# Patient Record
Sex: Female | Born: 1970 | Race: White | Hispanic: No | Marital: Married | Smoking: Never smoker
Health system: Southern US, Community
[De-identification: ages and names within clinical notes are randomized; demographics above are authoritative.]

## PROBLEM LIST (undated history)

## (undated) DIAGNOSIS — M1990 Unspecified osteoarthritis, unspecified site: Secondary | ICD-10-CM

## (undated) DIAGNOSIS — K219 Gastro-esophageal reflux disease without esophagitis: Secondary | ICD-10-CM

## (undated) DIAGNOSIS — I10 Essential (primary) hypertension: Secondary | ICD-10-CM

## (undated) HISTORY — PX: FOOT SURGERY: SHX648

## (undated) HISTORY — DX: Unspecified osteoarthritis, unspecified site: M19.90

## (undated) HISTORY — PX: JOINT REPLACEMENT: SHX530

## (undated) HISTORY — DX: Essential (primary) hypertension: I10

## (undated) HISTORY — DX: Gastro-esophageal reflux disease without esophagitis: K21.9

---

## 2003-01-03 HISTORY — PX: TUBAL LIGATION: SHX77

## 2006-01-11 ENCOUNTER — Ambulatory Visit: Payer: Self-pay

## 2010-04-27 ENCOUNTER — Ambulatory Visit: Payer: Self-pay

## 2011-06-16 LAB — HM PAP SMEAR: HM PAP: NEGATIVE

## 2011-06-20 ENCOUNTER — Ambulatory Visit: Payer: Self-pay

## 2012-06-25 ENCOUNTER — Ambulatory Visit: Payer: Self-pay | Admitting: Obstetrics and Gynecology

## 2012-07-15 ENCOUNTER — Ambulatory Visit: Payer: Self-pay | Admitting: Obstetrics and Gynecology

## 2013-02-07 ENCOUNTER — Ambulatory Visit: Payer: Self-pay | Admitting: Obstetrics and Gynecology

## 2016-02-08 DIAGNOSIS — M1711 Unilateral primary osteoarthritis, right knee: Secondary | ICD-10-CM | POA: Diagnosis not present

## 2016-05-18 DIAGNOSIS — S46011A Strain of muscle(s) and tendon(s) of the rotator cuff of right shoulder, initial encounter: Secondary | ICD-10-CM | POA: Diagnosis not present

## 2016-07-10 DIAGNOSIS — M1711 Unilateral primary osteoarthritis, right knee: Secondary | ICD-10-CM | POA: Diagnosis not present

## 2016-07-10 DIAGNOSIS — S83281A Other tear of lateral meniscus, current injury, right knee, initial encounter: Secondary | ICD-10-CM | POA: Diagnosis not present

## 2016-07-17 DIAGNOSIS — M25561 Pain in right knee: Secondary | ICD-10-CM | POA: Diagnosis not present

## 2016-11-08 ENCOUNTER — Encounter: Payer: Self-pay | Admitting: Family Medicine

## 2016-11-08 ENCOUNTER — Other Ambulatory Visit: Payer: Self-pay

## 2016-11-08 ENCOUNTER — Ambulatory Visit: Payer: 59 | Admitting: Family Medicine

## 2016-11-08 VITALS — BP 126/84 | HR 69 | Temp 98.5°F | Resp 16 | Ht 61.0 in | Wt 194.0 lb

## 2016-11-08 DIAGNOSIS — Z7689 Persons encountering health services in other specified circumstances: Secondary | ICD-10-CM | POA: Diagnosis not present

## 2016-11-08 DIAGNOSIS — L659 Nonscarring hair loss, unspecified: Secondary | ICD-10-CM

## 2016-11-08 DIAGNOSIS — Z6836 Body mass index (BMI) 36.0-36.9, adult: Secondary | ICD-10-CM

## 2016-11-08 DIAGNOSIS — M179 Osteoarthritis of knee, unspecified: Secondary | ICD-10-CM | POA: Insufficient documentation

## 2016-11-08 DIAGNOSIS — E669 Obesity, unspecified: Secondary | ICD-10-CM | POA: Diagnosis not present

## 2016-11-08 DIAGNOSIS — I872 Venous insufficiency (chronic) (peripheral): Secondary | ICD-10-CM | POA: Diagnosis not present

## 2016-11-08 DIAGNOSIS — Z23 Encounter for immunization: Secondary | ICD-10-CM | POA: Diagnosis not present

## 2016-11-08 DIAGNOSIS — M1710 Unilateral primary osteoarthritis, unspecified knee: Secondary | ICD-10-CM | POA: Insufficient documentation

## 2016-11-08 DIAGNOSIS — M170 Bilateral primary osteoarthritis of knee: Secondary | ICD-10-CM | POA: Diagnosis not present

## 2016-11-08 DIAGNOSIS — Z114 Encounter for screening for human immunodeficiency virus [HIV]: Secondary | ICD-10-CM

## 2016-11-08 NOTE — Assessment & Plan Note (Signed)
Encouraged patient on weight loss Continue weight watchers

## 2016-11-08 NOTE — Addendum Note (Signed)
Addended by: Brennan Bailey on: 11/08/2016 02:19 PM   Modules accepted: Orders

## 2016-11-08 NOTE — Progress Notes (Signed)
Patient: Kendra Kim Female    DOB: Jul 14, 1970   46 y.o.   MRN: [id number] Visit Date: 11/08/2016  Today's Provider: Lavon Paganini, MD   Chief Complaint  Patient presents with  . Establish Care   Subjective:    HPI   Kendra Kim presents to establish care. She is c/o hair thinning, which is improved with Biotin, MVI, and coconut oil treatments once weekly. She is also c/o ankle/feet swelling. Swelling is associated with traveling, and is improved with OTC diuretics (diurex) - thinks she is taking this at least 3-4 times weekly. She agrees to update her tetanus shot, but declines flu.  R knee pain: Diagnosed with OA, seeing orthopedics, s/p cortisone injection in R knee in 01/2016, 07/2016.  Also doing PT which helps.  Notices it hurts worse when raining or weather changes.  Using ice and heat.  Doing weight watchers and down 28lbs since starting.  She is motivated to continue to lose weight.  No Known Allergies   Current Outpatient Medications:  .  BIOTIN PO, Take by mouth., Disp: , Rfl:  .  Multiple Vitamin (MULTIVITAMIN) capsule, Take 1 capsule daily by mouth., Disp: , Rfl:   Review of Systems  Constitutional: Negative.   HENT: Negative.   Eyes: Negative.   Respiratory: Negative.   Cardiovascular: Positive for leg swelling. Negative for chest pain and palpitations.  Gastrointestinal: Negative.   Endocrine: Negative.   Genitourinary: Negative.   Musculoskeletal: Negative.   Skin: Negative.   Allergic/Immunologic: Negative.   Neurological: Negative.   Hematological: Negative.   Psychiatric/Behavioral: Negative.    History reviewed. No pertinent past medical history.  Past Surgical History:  Procedure Laterality Date  . FOOT SURGERY Right    x 2, realigned 2nd toe and then fused, by podiatrist (Dr Paulla Dolly)  . TUBAL LIGATION  2005   Family History  Problem Relation Age of Onset  . Epilepsy Mother   . Osteoporosis Mother   . Colon cancer Mother 34  .  Macular degeneration Mother   . Deep vein thrombosis Mother        after ortho injury  . Hypertension Father   . Glaucoma Father   . Bipolar disorder Sister   . Lymphoma Sister   . Breast cancer Maternal Grandmother 80  . Diabetes Paternal Grandmother   . Diabetes Paternal Grandfather     Social History   Tobacco Use  . Smoking status: Not on file  Substance Use Topics  . Alcohol use: Not on file   Objective:   BP 126/84 (BP Location: Left Arm, Patient Position: Sitting, Cuff Size: Large)   Pulse 69   Temp 98.5 F (36.9 C) (Oral)   Resp 16   Ht 5\' 1"  (1.549 m)   Wt 194 lb (88 kg)   SpO2 98%   BMI 36.66 kg/m  Vitals:   11/08/16 1009  BP: 126/84  Pulse: 69  Resp: 16  Temp: 98.5 F (36.9 C)  TempSrc: Oral  SpO2: 98%  Weight: 194 lb (88 kg)  Height: 5\' 1"  (1.549 m)     Physical Exam  Constitutional: She is oriented to person, place, and time. She appears well-developed and well-nourished. No distress.  Cardiovascular: Normal rate, regular rhythm, normal heart sounds and intact distal pulses.  No murmur heard. Pulmonary/Chest: Effort normal and breath sounds normal. No respiratory distress. She has no wheezes. She has no rales.  Abdominal: Soft. Bowel sounds are normal. She exhibits no distension.  There is no tenderness. There is no rebound and no guarding.  Musculoskeletal: She exhibits no edema or deformity.  Neurological: She is alert and oriented to person, place, and time. No cranial nerve deficit.  Skin: Skin is warm and dry. No rash noted.  Psychiatric: She has a normal mood and affect. Her behavior is normal.  Vitals reviewed.       Assessment & Plan:      Problem List Items Addressed This Visit      Cardiovascular and Mediastinum   Venous insufficiency of both lower extremities    Reassured patient that history, exam c/w venous insufficiency Check CBC, CMP, TSH to evaluate for systemic causes Advised compression stockings No diuretic indicated  at this time      Relevant Medications   Caffeine-Magnesium Salicylate (DIUREX PO)   Other Relevant Orders   CBC   Comprehensive metabolic panel   TSH     Musculoskeletal and Integument   Knee osteoarthritis    Discussed goal of QoL and ability to perform daily activities in managing knee OA No pain currently, but has trouble with R knee Can continue prn knee injections Will send ROI to Ortho        Other   Hair loss    No baldness or scalp rash noted on exam Suspect normal hair loss Check labs Ok to continue vitamins      Relevant Orders   CBC   TSH   Obesity    Encouraged patient on weight loss Continue weight watchers      Relevant Orders   CBC   Comprehensive metabolic panel   Lipid panel   TSH   Hemoglobin A1c    Other Visit Diagnoses    Encounter to establish care    -  Primary   Encounter for screening for HIV       Relevant Orders   HIV antibody (with reflex)      Return in about 2 months (around 01/08/2017) for CPE.      The entirety of the information documented in the History of Present Illness, Review of Systems and Physical Exam were personally obtained by me. Portions of this information were initially documented by Raquel Sarna Ratchford, CMA and reviewed by me for thoroughness and accuracy.     Kendra Paganini, MD  [facility]

## 2016-11-08 NOTE — Assessment & Plan Note (Signed)
Discussed goal of QoL and ability to perform daily activities in managing knee OA No pain currently, but has trouble with R knee Can continue prn knee injections Will send ROI to Ortho

## 2016-11-08 NOTE — Assessment & Plan Note (Signed)
Reassured patient that history, exam c/w venous insufficiency Check CBC, CMP, TSH to evaluate for systemic causes Advised compression stockings No diuretic indicated at this time

## 2016-11-08 NOTE — Assessment & Plan Note (Signed)
No baldness or scalp rash noted on exam Suspect normal hair loss Check labs Ok to continue vitamins

## 2016-11-08 NOTE — Patient Instructions (Signed)

## 2016-11-10 DIAGNOSIS — L659 Nonscarring hair loss, unspecified: Secondary | ICD-10-CM | POA: Diagnosis not present

## 2016-11-10 DIAGNOSIS — I872 Venous insufficiency (chronic) (peripheral): Secondary | ICD-10-CM | POA: Diagnosis not present

## 2016-11-11 LAB — COMPREHENSIVE METABOLIC PANEL
A/G RATIO: 1.8 (ref 1.2–2.2)
ALBUMIN: 4.4 g/dL (ref 3.5–5.5)
ALT: 22 IU/L (ref 0–32)
AST: 16 IU/L (ref 0–40)
Alkaline Phosphatase: 144 IU/L — ABNORMAL HIGH (ref 39–117)
BILIRUBIN TOTAL: 0.5 mg/dL (ref 0.0–1.2)
BUN / CREAT RATIO: 18 (ref 9–23)
BUN: 14 mg/dL (ref 6–24)
CHLORIDE: 104 mmol/L (ref 96–106)
CO2: 22 mmol/L (ref 20–29)
Calcium: 9.6 mg/dL (ref 8.7–10.2)
Creatinine, Ser: 0.78 mg/dL (ref 0.57–1.00)
GFR calc non Af Amer: 91 mL/min/{1.73_m2} (ref >59)
GFR, EST AFRICAN AMERICAN: 105 mL/min/{1.73_m2} (ref >59)
GLOBULIN, TOTAL: 2.5 g/dL (ref 1.5–4.5)
Glucose: 86 mg/dL (ref 65–99)
POTASSIUM: 4.7 mmol/L (ref 3.5–5.2)
SODIUM: 141 mmol/L (ref 134–144)
TOTAL PROTEIN: 6.9 g/dL (ref 6.0–8.5)

## 2016-11-11 LAB — LIPID PANEL
Chol/HDL Ratio: 2.9 ratio (ref 0.0–4.4)
Cholesterol, Total: 162 mg/dL (ref 100–199)
HDL: 56 mg/dL (ref >39)
LDL Calculated: 96 mg/dL (ref 0–99)
Triglycerides: 52 mg/dL (ref 0–149)
VLDL CHOLESTEROL CAL: 10 mg/dL (ref 5–40)

## 2016-11-11 LAB — CBC
HEMATOCRIT: 39.3 % (ref 34.0–46.6)
Hemoglobin: 13.3 g/dL (ref 11.1–15.9)
MCH: 30.8 pg (ref 26.6–33.0)
MCHC: 33.8 g/dL (ref 31.5–35.7)
MCV: 91 fL (ref 79–97)
Platelets: 303 10*3/uL (ref 150–379)
RBC: 4.32 x10E6/uL (ref 3.77–5.28)
RDW: 13 % (ref 12.3–15.4)
WBC: 4 10*3/uL (ref 3.4–10.8)

## 2016-11-11 LAB — HEMOGLOBIN A1C
ESTIMATED AVERAGE GLUCOSE: 103 mg/dL
Hgb A1c MFr Bld: 5.2 % (ref 4.8–5.6)

## 2016-11-11 LAB — HIV ANTIBODY (ROUTINE TESTING W REFLEX): HIV SCREEN 4TH GENERATION: NONREACTIVE

## 2016-11-11 LAB — TSH: TSH: 1.45 u[IU]/mL (ref 0.450–4.500)

## 2016-11-13 ENCOUNTER — Telehealth: Payer: Self-pay

## 2016-11-13 NOTE — Telephone Encounter (Signed)
-----   Message from Virginia Crews, MD sent at 11/13/2016  8:30 AM EST ----- Normal Blood counts, kidney function, liver function, electrolytes, cholesterol, Thyroid function, A1c (no diabetes).  Negative HIV screening  Bacigalupo, Dionne Bucy, MD, MPH [facility] 11/13/2016 8:29 AM

## 2016-11-13 NOTE — Telephone Encounter (Signed)
lmtcb

## 2016-11-14 NOTE — Telephone Encounter (Signed)
lmtcb

## 2016-11-16 NOTE — Telephone Encounter (Signed)
lmtcb but also mailed letter.

## 2016-11-16 NOTE — Telephone Encounter (Signed)
Pt called back. Advised of results.

## 2016-11-17 ENCOUNTER — Encounter: Payer: Self-pay | Admitting: Family Medicine

## 2017-01-08 ENCOUNTER — Encounter: Payer: Self-pay | Admitting: Family Medicine

## 2017-02-20 ENCOUNTER — Encounter: Payer: Self-pay | Admitting: Family Medicine

## 2017-03-22 ENCOUNTER — Encounter: Payer: Self-pay | Admitting: Family Medicine

## 2017-03-22 ENCOUNTER — Ambulatory Visit (INDEPENDENT_AMBULATORY_CARE_PROVIDER_SITE_OTHER): Payer: 59 | Admitting: Family Medicine

## 2017-03-22 VITALS — BP 122/84 | HR 74 | Temp 98.7°F | Resp 16 | Ht 61.0 in | Wt 200.0 lb

## 2017-03-22 DIAGNOSIS — Z1231 Encounter for screening mammogram for malignant neoplasm of breast: Secondary | ICD-10-CM | POA: Diagnosis not present

## 2017-03-22 DIAGNOSIS — E669 Obesity, unspecified: Secondary | ICD-10-CM | POA: Diagnosis not present

## 2017-03-22 DIAGNOSIS — E66812 Obesity, class 2: Secondary | ICD-10-CM

## 2017-03-22 DIAGNOSIS — Z124 Encounter for screening for malignant neoplasm of cervix: Secondary | ICD-10-CM | POA: Diagnosis not present

## 2017-03-22 DIAGNOSIS — Z0000 Encounter for general adult medical examination without abnormal findings: Secondary | ICD-10-CM | POA: Diagnosis not present

## 2017-03-22 DIAGNOSIS — Z6837 Body mass index (BMI) 37.0-37.9, adult: Secondary | ICD-10-CM

## 2017-03-22 DIAGNOSIS — Z1239 Encounter for other screening for malignant neoplasm of breast: Secondary | ICD-10-CM

## 2017-03-22 NOTE — Progress Notes (Signed)
Patient: Kendra Kim, Female    DOB: 1970-04-04, 47 y.o.   MRN: [id number] Visit Date: 03/22/2017  Today's Provider: Lavon Paganini, MD   I, Martha Clan, CMA, am acting as scribe for Lavon Paganini, MD.  Chief Complaint  Patient presents with  . Annual Exam   Subjective:    Annual physical exam Kendra Kim is a 47 y.o. female who presents today for health maintenance and complete physical. She feels fairly well. She is c/o right knee pain, which was diagnosed as osteoarthritis. Was advised at [facility] that PCP will do injections. She states her last injection was July, 2018.   She reports exercising twice a week for 45 minutes with a trainer; also does cardio when she is able. She reports she is sleeping well.  Has been doing weight watchers, but not doing well the last several weeks.  Last mammogram- 02/07/2013- UL Right- BI-RADS 1 Last pap- 06/16/2011- NIL; HPV negative. No h/o abnormal pap smears. -----------------------------------------------------------------   Review of Systems  Constitutional: Negative.   HENT: Positive for postnasal drip and sinus pressure. Negative for congestion, dental problem, drooling, ear discharge, ear pain, facial swelling, hearing loss, mouth sores, nosebleeds, rhinorrhea, sinus pain, sneezing, sore throat, tinnitus, trouble swallowing and voice change.   Eyes: Negative.   Respiratory: Positive for cough. Negative for apnea, choking, chest tightness, shortness of breath, wheezing and stridor.   Cardiovascular: Negative.   Gastrointestinal: Negative.   Endocrine: Negative.   Genitourinary: Negative.   Musculoskeletal: Positive for arthralgias. Negative for back pain, gait problem, joint swelling, myalgias, neck pain and neck stiffness.  Skin: Negative.   Allergic/Immunologic: Negative.   Neurological: Negative.   Hematological: Negative.   Psychiatric/Behavioral: Negative.     Social History      She  reports that she  has never smoked. She has never used smokeless tobacco. She reports that she drinks alcohol. She reports that she does not use drugs.       Social History   Socioeconomic History  . Marital status: Married    Spouse name: Gerald Stabs  . Number of children: 3  . Years of education: 56  . Highest education level: Associate degree: academic program  Occupational History    Employer: [employer]  Social Needs  . Financial resource strain: Not hard at all  . Food insecurity:    Worry: Never true    Inability: Never true  . Transportation needs:    Medical: No    Non-medical: No  Tobacco Use  . Smoking status: Never Smoker  . Smokeless tobacco: Never Used  Substance and Sexual Activity  . Alcohol use: Yes    Comment: social  . Drug use: Never  . Sexual activity: Yes    Partners: Male    Birth control/protection: Surgical  Lifestyle  . Physical activity:    Days per week: 2 days    Minutes per session: 40 min  . Stress: Not on file  Relationships  . Social connections:    Talks on phone: Not on file    Gets together: Not on file    Attends religious service: Not on file    Active member of club or organization: Not on file    Attends meetings of clubs or organizations: Not on file    Relationship status: Not on file  Other Topics Concern  . Not on file  Social History Narrative  . Not on file    History reviewed. No pertinent past  medical history.   Patient Active Problem List   Diagnosis Date Noted  . Venous insufficiency of both lower extremities 11/08/2016  . Hair loss 11/08/2016  . Obesity 11/08/2016  . Knee osteoarthritis 11/08/2016    Past Surgical History:  Procedure Laterality Date  . FOOT SURGERY Right    x 2, realigned 2nd toe and then fused, by podiatrist (Dr Paulla Dolly)  . TUBAL LIGATION  2005    Family History        Family Status  Relation Name Status  . Mother  Alive  . Father  Alive  . Sister  Alive  . MGM  (Not Specified)  . PGM  (Not Specified)    . PGF  (Not Specified)        Her family history includes Bipolar disorder in her sister; Breast cancer (age of onset: 75) in her maternal grandmother; Colon cancer (age of onset: 56) in her mother; Deep vein thrombosis in her mother; Diabetes in her paternal grandfather and paternal grandmother; Epilepsy in her mother; Glaucoma in her father; Hypertension in her father; Lymphoma in her sister; Macular degeneration in her mother; Osteoporosis in her mother.      No Known Allergies   Current Outpatient Medications:  .  BIOTIN PO, Take by mouth., Disp: , Rfl:  .  Caffeine-Magnesium Salicylate (DIUREX PO), Take 1 tablet 2 (two) times daily by mouth., Disp: , Rfl:  .  fluticasone (FLONASE) 50 MCG/ACT nasal spray, Place into both nostrils daily., Disp: , Rfl:  .  Multiple Vitamin (MULTIVITAMIN) capsule, Take 1 capsule daily by mouth., Disp: , Rfl:    Patient Care Team: Virginia Crews, MD as PCP - General (Family Medicine)      Objective:   Vitals: BP 122/84 (BP Location: Left Arm, Patient Position: Sitting, Cuff Size: Large)   Pulse 74   Temp 98.7 F (37.1 C) (Oral)   Resp 16   Ht 5\' 1"  (1.549 m)   Wt 200 lb (90.7 kg)   LMP 03/07/2017   SpO2 95%   BMI 37.79 kg/m    Vitals:   03/22/17 1014  BP: 122/84  Pulse: 74  Resp: 16  Temp: 98.7 F (37.1 C)  TempSrc: Oral  SpO2: 95%  Weight: 200 lb (90.7 kg)  Height: 5\' 1"  (1.549 m)     Physical Exam  Constitutional: She is oriented to person, place, and time. She appears well-developed and well-nourished. No distress.  HENT:  Head: Normocephalic and atraumatic.  Right Ear: External ear normal.  Left Ear: External ear normal.  Nose: Nose normal.  Mouth/Throat: Oropharynx is clear and moist.  Eyes: Pupils are equal, round, and reactive to light. Conjunctivae and EOM are normal. No scleral icterus.  Neck: Neck supple. No thyromegaly present.  Cardiovascular: Normal rate, regular rhythm, normal heart sounds and intact distal  pulses.  No murmur heard. Pulmonary/Chest: Effort normal and breath sounds normal. No respiratory distress. She has no wheezes. She has no rales.  Abdominal: Soft. Bowel sounds are normal. She exhibits no distension. There is no tenderness. There is no rebound and no guarding.  Genitourinary:  Genitourinary Comments: Breasts: breasts appear normal, no suspicious masses, no skin or nipple changes or axillary nodes. GYN:  External genitalia within normal limits.  Vaginal mucosa pink, moist, normal rugae.  Nonfriable cervix without lesions, no discharge or bleeding noted on speculum exam.  Bimanual exam revealed normal, nongravid uterus.  No cervical motion tenderness. No adnexal masses bilaterally.     Musculoskeletal:  She exhibits no edema or deformity.  Lymphadenopathy:    She has no cervical adenopathy.  Neurological: She is alert and oriented to person, place, and time.  Skin: Skin is warm and dry. No rash noted.  Psychiatric: She has a normal mood and affect. Her behavior is normal.  Vitals reviewed.    Depression Screen PHQ 2/9 Scores 11/08/2016  PHQ - 2 Score 0    Assessment & Plan:     Routine Health Maintenance and Physical Exam  Exercise Activities and Dietary recommendations Goals    None      Immunization History  Administered Date(s) Administered  . Tdap 11/08/2016    Health Maintenance  Topic Date Due  . PAP SMEAR  06/16/2014  . INFLUENZA VACCINE  04/02/2017 (Originally 08/02/2016)  . TETANUS/TDAP  11/09/2026  . HIV Screening  Completed     Discussed health benefits of physical activity, and encouraged her to engage in regular exercise appropriate for her age and condition.    -------------------------------------------------------------------- Screening for breast cancer - MM SCREENING BREAST TOMO BILATERAL; Future  Cervical cancer screening - Pap IG and HPV (high risk) DNA detection   Return in about 1 year (around 03/23/2018) for  physical.  Discussed that we will f/u in 1-2 weeks for knee OA and discuss knee injections at that time.   The entirety of the information documented in the History of Present Illness, Review of Systems and Physical Exam were personally obtained by me. Portions of this information were initially documented by Raquel Sarna Ratchford, CMA and reviewed by me for thoroughness and accuracy.    Virginia Crews, MD, MPH [facility] 03/22/2017 10:55 AM

## 2017-03-22 NOTE — Patient Instructions (Signed)
Preventive Care 40-64 Years, Female Preventive care refers to lifestyle choices and visits with your health care provider that can promote health and wellness. What does preventive care include?  A yearly physical exam. This is also called an annual well check.  Dental exams once or twice a year.  Routine eye exams. Ask your health care provider how often you should have your eyes checked.  Personal lifestyle choices, including: ? Daily care of your teeth and gums. ? Regular physical activity. ? Eating a healthy diet. ? Avoiding tobacco and drug use. ? Limiting alcohol use. ? Practicing safe sex. ? Taking low-dose aspirin daily starting at age 58. ? Taking vitamin and mineral supplements as recommended by your health care provider. What happens during an annual well check? The services and screenings done by your health care provider during your annual well check will depend on your age, overall health, lifestyle risk factors, and family history of disease. Counseling Your health care provider may ask you questions about your:  Alcohol use.  Tobacco use.  Drug use.  Emotional well-being.  Home and relationship well-being.  Sexual activity.  Eating habits.  Work and work Statistician.  Method of birth control.  Menstrual cycle.  Pregnancy history.  Screening You may have the following tests or measurements:  Height, weight, and BMI.  Blood pressure.  Lipid and cholesterol levels. These may be checked every 5 years, or more frequently if you are over 81 years old.  Skin check.  Lung cancer screening. You may have this screening every year starting at age 78 if you have a 30-pack-year history of smoking and currently smoke or have quit within the past 15 years.  Fecal occult blood test (FOBT) of the stool. You may have this test every year starting at age 65.  Flexible sigmoidoscopy or colonoscopy. You may have a sigmoidoscopy every 5 years or a colonoscopy  every 10 years starting at age 30.  Hepatitis C blood test.  Hepatitis B blood test.  Sexually transmitted disease (STD) testing.  Diabetes screening. This is done by checking your blood sugar (glucose) after you have not eaten for a while (fasting). You may have this done every 1-3 years.  Mammogram. This may be done every 1-2 years. Talk to your health care provider about when you should start having regular mammograms. This may depend on whether you have a family history of breast cancer.  BRCA-related cancer screening. This may be done if you have a family history of breast, ovarian, tubal, or peritoneal cancers.  Pelvic exam and Pap test. This may be done every 3 years starting at age 80. Starting at age 36, this may be done every 5 years if you have a Pap test in combination with an HPV test.  Bone density scan. This is done to screen for osteoporosis. You may have this scan if you are at high risk for osteoporosis.  Discuss your test results, treatment options, and if necessary, the need for more tests with your health care provider. Vaccines Your health care provider may recommend certain vaccines, such as:  Influenza vaccine. This is recommended every year.  Tetanus, diphtheria, and acellular pertussis (Tdap, Td) vaccine. You may need a Td booster every 10 years.  Varicella vaccine. You may need this if you have not been vaccinated.  Zoster vaccine. You may need this after age 5.  Measles, mumps, and rubella (MMR) vaccine. You may need at least one dose of MMR if you were born in  1957 or later. You may also need a second dose.  Pneumococcal 13-valent conjugate (PCV13) vaccine. You may need this if you have certain conditions and were not previously vaccinated.  Pneumococcal polysaccharide (PPSV23) vaccine. You may need one or two doses if you smoke cigarettes or if you have certain conditions.  Meningococcal vaccine. You may need this if you have certain  conditions.  Hepatitis A vaccine. You may need this if you have certain conditions or if you travel or work in places where you may be exposed to hepatitis A.  Hepatitis B vaccine. You may need this if you have certain conditions or if you travel or work in places where you may be exposed to hepatitis B.  Haemophilus influenzae type b (Hib) vaccine. You may need this if you have certain conditions.  Talk to your health care provider about which screenings and vaccines you need and how often you need them. This information is not intended to replace advice given to you by your health care provider. Make sure you discuss any questions you have with your health care provider. Document Released: 01/15/2015 Document Revised: 09/08/2015 Document Reviewed: 10/20/2014 Elsevier Interactive Patient Education  2018 Elsevier Inc.  

## 2017-03-26 ENCOUNTER — Telehealth: Payer: Self-pay

## 2017-03-26 ENCOUNTER — Ambulatory Visit: Payer: 59 | Admitting: Family Medicine

## 2017-03-26 ENCOUNTER — Encounter: Payer: Self-pay | Admitting: Family Medicine

## 2017-03-26 VITALS — BP 130/84 | HR 72 | Temp 98.2°F | Resp 16

## 2017-03-26 DIAGNOSIS — M170 Bilateral primary osteoarthritis of knee: Secondary | ICD-10-CM

## 2017-03-26 LAB — PAP IG AND HPV HIGH-RISK
HPV, high-risk: NEGATIVE
PAP Smear Comment: 0

## 2017-03-26 MED ORDER — METHYLPREDNISOLONE ACETATE 80 MG/ML IJ SUSP
80.0000 mg | Freq: Once | INTRAMUSCULAR | Status: AC
  Administered 2017-03-26: 80 mg via INTRAMUSCULAR

## 2017-03-26 NOTE — Addendum Note (Signed)
Addended by: Brennan Bailey on: 03/26/2017 08:50 AM   Modules accepted: Orders

## 2017-03-26 NOTE — Progress Notes (Signed)
Patient: Kendra Kim Female    DOB: 27-Sep-1970   47 y.o.   MRN: [id number] Visit Date: 03/26/2017  Today's Provider: Lavon Paganini, MD   Chief Complaint  Patient presents with  . Knee Pain   Subjective:    Knee Pain   There was no injury mechanism. The pain is present in the right knee. The quality of the pain is described as aching. The pain is mild. The pain has been worsening since onset. Pertinent negatives include no inability to bear weight, loss of motion, loss of sensation, muscle weakness, numbness or tingling. Associated symptoms comments: Crepitus is present. The symptoms are aggravated by movement. Treatments tried: steroid injections; has not had an injection since July. The treatment provided moderate relief.      No Known Allergies   Current Outpatient Medications:  .  BIOTIN PO, Take by mouth., Disp: , Rfl:  .  Caffeine-Magnesium Salicylate (DIUREX PO), Take 1 tablet 2 (two) times daily by mouth., Disp: , Rfl:  .  fluticasone (FLONASE) 50 MCG/ACT nasal spray, Place into both nostrils daily., Disp: , Rfl:  .  Multiple Vitamin (MULTIVITAMIN) capsule, Take 1 capsule daily by mouth., Disp: , Rfl:   Review of Systems  Constitutional: Negative for activity change, appetite change, chills, diaphoresis, fatigue, fever and unexpected weight change.  Cardiovascular: Positive for leg swelling. Negative for chest pain and palpitations.  Musculoskeletal: Positive for arthralgias.  Neurological: Negative for tingling and numbness.    Social History   Tobacco Use  . Smoking status: Never Smoker  . Smokeless tobacco: Never Used  Substance Use Topics  . Alcohol use: Yes    Comment: social   Objective:   BP 130/84 (BP Location: Left Arm, Patient Position: Sitting, Cuff Size: Normal)   Pulse 72   Temp 98.2 F (36.8 C) (Oral)   Resp 16   LMP 03/07/2017   SpO2 98%  Vitals:   03/26/17 0808  BP: 130/84  Pulse: 72  Resp: 16  Temp: 98.2 F (36.8 C)    TempSrc: Oral  SpO2: 98%     Physical Exam  Constitutional: She is oriented to person, place, and time. She appears well-developed and well-nourished. No distress.  HENT:  Head: Normocephalic and atraumatic.  Eyes: Conjunctivae are normal. No scleral icterus.  Cardiovascular: Normal rate and regular rhythm.  Pulmonary/Chest: Effort normal. No respiratory distress.  Musculoskeletal: She exhibits no edema.  R Knee: Mild swelling, no erythema, obvious bony abnormalities.  Palpation normal with no warmth or joint line tenderness or patellar tenderness or condyle tenderness. ROM normal in flexion and extension and lower leg rotation. Ligaments with solid consistent endpoints including ACL, PCL, LCL, MCL. Negative Mcmurray's and provocative meniscal tests. Non painful patellar compression. Patellar and quadriceps tendons unremarkable. Hamstring and quadriceps strength is normal.  Neurological: She is alert and oriented to person, place, and time.  Skin: Skin is warm and dry. No rash noted.  Psychiatric: She has a normal mood and affect. Her behavior is normal.  Vitals reviewed.       Assessment & Plan:    1. Primary osteoarthritis of both knees - primarily with pain in R knee - previously with good relief x6 months after coritcosteroid inject - will give injection today  Procedure: Patient was given informed consent, signed copy in the chart. Appropriate time out was taken. Area prepped and draped in usual sterile fashion. 1 cc of methylprednisolone 80 mg/ml plus  4 cc of 1%  lidocaine was injected into the R knee using a(n) anteromedial approach. The patient tolerated the procedure well. There were no complications. Post procedure instructions were given.   The entirety of the information documented in the History of Present Illness, Review of Systems and Physical Exam were personally obtained by me. Portions of this information were initially documented by Raquel Sarna Ratchford, CMA and  reviewed by me for thoroughness and accuracy.    Virginia Crews, MD, MPH [facility] 03/26/2017 8:31 AM

## 2017-03-26 NOTE — Telephone Encounter (Signed)
lmtcb

## 2017-03-26 NOTE — Telephone Encounter (Signed)
-----   Message from Virginia Crews, MD sent at 03/26/2017  1:54 PM EDT ----- Normal pap smear. HPV negative. Repeat in 3-5 years.  Virginia Crews, MD, MPH [facility] 03/26/2017 1:54 PM

## 2017-03-28 NOTE — Telephone Encounter (Signed)
Pt advised.

## 2017-04-10 ENCOUNTER — Telehealth: Payer: Self-pay | Admitting: Family Medicine

## 2017-04-10 NOTE — Telephone Encounter (Signed)
Faxed ROI to [facility] on 11/14/2016

## 2017-04-10 NOTE — Telephone Encounter (Signed)
Faxed ROI to [place] on 11/14/16

## 2017-08-30 ENCOUNTER — Ambulatory Visit: Payer: 59 | Attending: Family Medicine

## 2017-09-27 ENCOUNTER — Ambulatory Visit
Admission: RE | Admit: 2017-09-27 | Discharge: 2017-09-27 | Disposition: A | Discharge status: Home / Self Care | Payer: 59 | Source: Ambulatory Visit | Attending: Family Medicine | Admitting: Family Medicine

## 2017-09-27 DIAGNOSIS — Z1231 Encounter for screening mammogram for malignant neoplasm of breast: Secondary | ICD-10-CM | POA: Insufficient documentation

## 2017-09-27 DIAGNOSIS — Z1239 Encounter for other screening for malignant neoplasm of breast: Secondary | ICD-10-CM

## 2017-10-08 ENCOUNTER — Encounter: Payer: Self-pay | Admitting: Family Medicine

## 2017-10-08 ENCOUNTER — Ambulatory Visit: Payer: 59 | Admitting: Family Medicine

## 2017-10-08 VITALS — BP 152/82 | HR 86 | Temp 98.5°F | Wt 215.2 lb

## 2017-10-08 DIAGNOSIS — Z6841 Body Mass Index (BMI) 40.0 and over, adult: Secondary | ICD-10-CM

## 2017-10-08 DIAGNOSIS — M170 Bilateral primary osteoarthritis of knee: Secondary | ICD-10-CM | POA: Diagnosis not present

## 2017-10-08 MED ORDER — METHYLPREDNISOLONE ACETATE 80 MG/ML IJ SUSP
80.0000 mg | Freq: Once | INTRAMUSCULAR | Status: AC
  Administered 2017-10-08: 80 mg via INTRAMUSCULAR

## 2017-10-08 MED ORDER — LIDOCAINE HCL (PF) 1 % IJ SOLN
4.0000 mL | Freq: Once | INTRAMUSCULAR | Status: AC
  Administered 2017-10-08: 4 mL via INTRADERMAL

## 2017-10-08 NOTE — Patient Instructions (Signed)

## 2017-10-08 NOTE — Progress Notes (Signed)
Patient: Kendra Kim Female    DOB: 1970-09-28   47 y.o.   MRN: [id number] Visit Date: 10/08/2017  Today's Provider: Lavon Paganini, MD   No chief complaint on file.  Subjective:    HPI Knee Osteoarthritis:  Patient presents today requesting repeat knee cortisone injection for chronic pain due to OA.  Her R knee is the most painful.  She has not had injectionsin L knee, but feels like it will eventually need it due to putting stress on it to help the R. Last injection was on 03/26/2017. She got 6 months of relief.  She is exercising at least 3 times weekly.  She is working with a Physiological scientist.  She is also here to discuss Obesity.  She needs a biometric form completed for [employer].  She is working with [employer] and a Physiological scientist.  She exercises 3-5 times weekly.  Does both aerobic her size and weight lifting.  She is frustrated by her lack of weight loss.   No Known Allergies   Current Outpatient Medications:  .  BIOTIN PO, Take by mouth., Disp: , Rfl:  .  Caffeine-Magnesium Salicylate (DIUREX PO), Take 1 tablet 2 (two) times daily by mouth., Disp: , Rfl:  .  fluticasone (FLONASE) 50 MCG/ACT nasal spray, Place into both nostrils daily., Disp: , Rfl:  .  Multiple Vitamin (MULTIVITAMIN) capsule, Take 1 capsule daily by mouth., Disp: , Rfl:   Review of Systems  Constitutional: Negative.   Respiratory: Negative.   Cardiovascular: Negative.   Musculoskeletal: Positive for arthralgias (bilateral knee pain ). Negative for gait problem and joint swelling.  Skin: Negative.   Neurological: Negative.   Psychiatric/Behavioral: Negative.     Social History   Tobacco Use  . Smoking status: Never Smoker  . Smokeless tobacco: Never Used  Substance Use Topics  . Alcohol use: Yes    Comment: social   Objective:   There were no vitals taken for this visit. There were no vitals filed for this visit.   Physical Exam  Constitutional: She is oriented to  person, place, and time. She appears well-developed and well-nourished. No distress.  HENT:  Head: Normocephalic and atraumatic.  Eyes: Conjunctivae are normal. No scleral icterus.  Neck: Neck supple. No thyromegaly present.  Cardiovascular: Normal rate, regular rhythm, normal heart sounds and intact distal pulses.  No murmur heard. Pulmonary/Chest: Effort normal and breath sounds normal. No respiratory distress. She has no wheezes.  Musculoskeletal: She exhibits no edema or deformity.  R Knee: No swelling, erythema, or obvious bony abnormalities.  Palpation normal with no warmth or joint line tenderness or patellar tenderness or condyle tenderness. ROM normal in flexion and extension and lower leg rotation. Ligaments with solid consistent endpoints including ACL, PCL, LCL, MCL. Negative Mcmurray's and provocative meniscal tests. Non painful patellar compression. Patellar and quadriceps tendons unremarkable. Hamstring and quadriceps strength is normal.   Neurological: She is alert and oriented to person, place, and time.  Skin: Skin is warm and dry. Capillary refill takes less than 2 seconds. No rash noted.  Psychiatric: She has a normal mood and affect. Her behavior is normal.       Assessment & Plan:   Problem List Items Addressed This Visit      Musculoskeletal and Integument   Knee osteoarthritis - Primary    Discuss goals of pain control, quality of life, and ability to perform daily activities and managing knee osteoarthritis Exam is fairly  benign, but she does have pain with exercise We can continue as needed knee injections Right knee corticosteroid injection performed today-see procedure note Discussed home exercise program including quad strengthening and hip abductor strengthening Return precautions discussed      Relevant Medications   lidocaine (PF) (XYLOCAINE) 1 % injection 4 mL (Completed)   methylPREDNISolone acetate (DEPO-MEDROL) injection 80 mg (Completed)       Other   Obesity    Chronic Encourage patient on weight loss Continue weight watchers and exercise Biometric form completed- plan is to continue with weight watchers and personal trainer with goal of 5% weight loss in the next year         INJECTION: Patient was given informed consent, signed copy in the chart. Appropriate time out was taken. Area prepped and draped in usual sterile fashion. 1 cc of Depo-Medrol 80 mg/ml plus  4 cc of 1% lidocaine without epinephrine was injected into the R knee using a(n) anterolateral approach. The patient tolerated the procedure well. There were no complications. Post procedure instructions were given.   Return if symptoms worsen or fail to improve.   The entirety of the information documented in the History of Present Illness, Review of Systems and Physical Exam were personally obtained by me. Portions of this information were initially documented by Tiburcio Pea and Hurman Horn, CMA and reviewed by me for thoroughness and accuracy.    Virginia Crews, MD, MPH [facility] 10/10/2017 8:23 AM

## 2017-10-10 NOTE — Assessment & Plan Note (Signed)
Chronic Encourage patient on weight loss Continue weight watchers and exercise Biometric form completed- plan is to continue with weight watchers and personal trainer with goal of 5% weight loss in the next year

## 2017-10-10 NOTE — Assessment & Plan Note (Signed)
Discuss goals of pain control, quality of life, and ability to perform daily activities and managing knee osteoarthritis Exam is fairly benign, but she does have pain with exercise We can continue as needed knee injections Right knee corticosteroid injection performed today-see procedure note Discussed home exercise program including quad strengthening and hip abductor strengthening Return precautions discussed

## 2018-01-10 ENCOUNTER — Encounter: Payer: Self-pay | Admitting: Family Medicine

## 2018-01-10 ENCOUNTER — Ambulatory Visit: Payer: 59 | Admitting: Family Medicine

## 2018-01-10 VITALS — BP 147/91 | HR 80 | Temp 98.5°F | Wt 223.6 lb

## 2018-01-10 DIAGNOSIS — M62838 Other muscle spasm: Secondary | ICD-10-CM

## 2018-01-10 MED ORDER — CYCLOBENZAPRINE HCL 5 MG PO TABS: 5.0000 mg | ORAL_TABLET | Freq: Three times a day (TID) | ORAL | 1 refills | Status: DC | PRN

## 2018-01-10 NOTE — Progress Notes (Signed)
Patient: Kendra Kim Female    DOB: 04-11-1970   48 y.o.   MRN: [id number] Visit Date: 01/10/2018  Today's Provider: Lavon Paganini, MD   Chief Complaint  Patient presents with  . Neck Pain   Subjective:    I, Tiburcio Pea, CMA, am acting as a scribe for Lavon Paganini, MD.   Neck Pain   This is a new problem. Episode onset: approximately 3 months ago. The problem occurs constantly. The problem has been gradually worsening. The pain is associated with nothing. The pain is present in the right side and occipital region. The quality of the pain is described as aching. The symptoms are aggravated by twisting, stress and position. Stiffness is present in the morning. Associated symptoms include headaches. Pertinent negatives include no fever, numbness, photophobia, tingling, weakness or weight loss. She has tried heat, ice, NSAIDs and home exercises for the symptoms. The treatment provided mild relief.    No Known Allergies   Current Outpatient Medications:  .  BIOTIN PO, Take by mouth., Disp: , Rfl:  .  Caffeine-Magnesium Salicylate (DIUREX PO), Take 1 tablet 2 (two) times daily by mouth., Disp: , Rfl:  .  fluticasone (FLONASE) 50 MCG/ACT nasal spray, Place into both nostrils daily., Disp: , Rfl:  .  Multiple Vitamin (MULTIVITAMIN) capsule, Take 1 capsule daily by mouth., Disp: , Rfl:  .  Probiotic Product (PROBIOTIC DAILY PO), Take by mouth., Disp: , Rfl:   Review of Systems  Constitutional: Negative.  Negative for fever and weight loss.  HENT: Negative.   Eyes: Negative for photophobia.  Respiratory: Negative.   Cardiovascular: Negative.   Gastrointestinal: Negative.   Musculoskeletal: Positive for neck pain.  Neurological: Positive for headaches. Negative for dizziness, tingling, weakness, light-headedness and numbness.  Hematological: Negative for adenopathy.  Psychiatric/Behavioral: Negative.     Social History   Tobacco Use  . Smoking status: Never Smoker    . Smokeless tobacco: Never Used  Substance Use Topics  . Alcohol use: Yes    Comment: social      Objective:   BP (!) 147/91 (BP Location: Right Arm, Patient Position: Sitting, Cuff Size: Large)   Pulse 80   Temp 98.5 F (36.9 C) (Oral)   Wt 223 lb 9.6 oz (101.4 kg)   SpO2 99%   BMI 42.25 kg/m  Vitals:   01/10/18 1310  BP: (!) 147/91  Pulse: 80  Temp: 98.5 F (36.9 C)  TempSrc: Oral  SpO2: 99%  Weight: 223 lb 9.6 oz (101.4 kg)     Physical Exam Vitals signs reviewed.  Constitutional:      General: She is not in acute distress.    Appearance: Normal appearance.  HENT:     Head: Normocephalic and atraumatic.     Right Ear: External ear normal.     Left Ear: External ear normal.     Nose: Nose normal.     Mouth/Throat:     Pharynx: Oropharynx is clear.  Eyes:     General: No scleral icterus.    Conjunctiva/sclera: Conjunctivae normal.  Neck:     Musculoskeletal: Neck supple. No neck rigidity.     Comments: ROM limited in rotation and lateral rotation b/l.  Tightness of SCMs and paraspinal muscles, as well as Trapezius muscles b/l R>L.  TTP over muscles spasms Cardiovascular:     Rate and Rhythm: Normal rate and regular rhythm.     Pulses: Normal pulses.     Heart sounds: Normal heart  sounds. No murmur.  Pulmonary:     Effort: Pulmonary effort is normal. No respiratory distress.     Breath sounds: Normal breath sounds. No wheezing.  Musculoskeletal:     Right lower leg: No edema.     Left lower leg: No edema.  Lymphadenopathy:     Cervical: No cervical adenopathy.  Skin:    General: Skin is warm and dry.     Capillary Refill: Capillary refill takes less than 2 seconds.     Findings: No rash.  Neurological:     General: No focal deficit present.     Mental Status: She is alert and oriented to person, place, and time. Mental status is at baseline.     Cranial Nerves: No cranial nerve deficit.     Sensory: No sensory deficit.     Motor: No weakness.      Gait: Gait normal.  Psychiatric:        Mood and Affect: Mood normal.        Behavior: Behavior normal.       Assessment & Plan   1. Neck muscle spasm - new problem - significant muscle spasms with some TTP - no signs of radiculopathy - discussed heat, stretching/HEP - flexeril prn - discussed return precautions    Meds ordered this encounter  Medications  . cyclobenzaprine (FLEXERIL) 5 MG tablet    Sig: Take 1 tablet (5 mg total) by mouth 3 (three) times daily as needed for muscle spasms.    Dispense:  30 tablet    Refill:  1     Return if symptoms worsen or fail to improve.   The entirety of the information documented in the History of Present Illness, Review of Systems and Physical Exam were personally obtained by me. Portions of this information were initially documented by Tiburcio Pea, CMA and reviewed by me for thoroughness and accuracy.    Virginia Crews, MD, MPH [facility] 01/10/2018 3:56 PM

## 2018-02-28 ENCOUNTER — Encounter: Payer: Self-pay | Admitting: Family Medicine

## 2018-02-28 ENCOUNTER — Ambulatory Visit: Payer: 59 | Admitting: Family Medicine

## 2018-02-28 VITALS — BP 139/86 | HR 102 | Temp 98.2°F | Wt 225.8 lb

## 2018-02-28 DIAGNOSIS — M170 Bilateral primary osteoarthritis of knee: Secondary | ICD-10-CM

## 2018-02-28 MED ORDER — METHYLPREDNISOLONE ACETATE 40 MG/ML IJ SUSP
80.0000 mg | Freq: Once | INTRAMUSCULAR | Status: AC
  Administered 2018-02-28: 80 mg

## 2018-02-28 MED ORDER — LIDOCAINE HCL (PF) 1 % IJ SOLN
4.0000 mL | Freq: Once | INTRAMUSCULAR | Status: AC
  Administered 2018-02-28: 4 mL

## 2018-02-28 NOTE — Progress Notes (Signed)
Patient: Kendra Kim Female    DOB: 06-16-1970   48 y.o.   MRN: [id number] Visit Date: 02/28/2018  Today's Provider: Lavon Paganini, MD   Chief Complaint  Patient presents with  . Knee Pain   Subjective:     Knee Pain   The incident occurred 3 to 5 days ago (Started Monday after exercising). The incident occurred at the gym. There was no injury mechanism. The pain is present in the right knee. The quality of the pain is described as aching. The pain is at a severity of 4/10. The pain is mild. The pain has been intermittent since onset. She reports no foreign bodies present. The symptoms are aggravated by movement and weight bearing. She has tried ice and NSAIDs for the symptoms. The treatment provided mild relief.   Last corticosteroid injection in right knee was in 10/2017.  This provided good relief and her pain and swelling has been well controlled and she has been able to do all of her ADLs and exercise.  She only started having pain 3 to 5 days ago.  No Known Allergies   Current Outpatient Medications:  .  BIOTIN PO, Take by mouth., Disp: , Rfl:  .  Caffeine-Magnesium Salicylate (DIUREX PO), Take 1 tablet 2 (two) times daily by mouth., Disp: , Rfl:  .  cyclobenzaprine (FLEXERIL) 5 MG tablet, Take 1 tablet (5 mg total) by mouth 3 (three) times daily as needed for muscle spasms., Disp: 30 tablet, Rfl: 1 .  fluticasone (FLONASE) 50 MCG/ACT nasal spray, Place into both nostrils daily., Disp: , Rfl:  .  Multiple Vitamin (MULTIVITAMIN) capsule, Take 1 capsule daily by mouth., Disp: , Rfl:  .  Probiotic Product (PROBIOTIC DAILY PO), Take by mouth., Disp: , Rfl:   Review of Systems  Constitutional: Negative.   HENT: Negative.   Respiratory: Negative.   Cardiovascular: Negative.   Genitourinary: Negative.   Musculoskeletal: Positive for arthralgias, joint swelling and neck pain. Negative for back pain and gait problem.  Psychiatric/Behavioral: Negative.     Social  History   Tobacco Use  . Smoking status: Never Smoker  . Smokeless tobacco: Never Used  Substance Use Topics  . Alcohol use: Yes    Comment: social      Objective:   BP 139/86 (BP Location: Left Arm, Patient Position: Sitting, Cuff Size: Large)   Pulse (!) 102   Temp 98.2 F (36.8 C) (Oral)   Wt 225 lb 12.8 oz (102.4 kg)   LMP 01/26/2018   SpO2 99%   BMI 42.66 kg/m  Vitals:   02/28/18 1333  BP: 139/86  Pulse: (!) 102  Temp: 98.2 F (36.8 C)  TempSrc: Oral  SpO2: 99%  Weight: 225 lb 12.8 oz (102.4 kg)     Physical Exam Vitals signs reviewed.  Constitutional:      General: She is not in acute distress.    Appearance: Normal appearance. She is not diaphoretic.  HENT:     Head: Normocephalic and atraumatic.  Eyes:     General: No scleral icterus.    Conjunctiva/sclera: Conjunctivae normal.  Cardiovascular:     Rate and Rhythm: Normal rate and regular rhythm.     Pulses: Normal pulses.     Heart sounds: Normal heart sounds. No murmur.  Pulmonary:     Effort: Pulmonary effort is normal. No respiratory distress.     Breath sounds: Normal breath sounds. No wheezing or rhonchi.  Musculoskeletal:  Right lower leg: No edema.     Left lower leg: No edema.     Comments: R Knee: Normal to inspection with no erythema or effusion or obvious bony abnormalities.  Palpation with no warmth, mild TTP over joint line ROM normal in flexion and extension and lower leg rotation. Ligaments with solid consistent endpoints including ACL, PCL, LCL, MCL. Negative Mcmurray's and provocative meniscal tests. Non painful patellar compression. Patellar and quadriceps tendons unremarkable. Hamstring and quadriceps strength is normal.   Skin:    General: Skin is warm and dry.     Capillary Refill: Capillary refill takes less than 2 seconds.     Findings: No rash.  Neurological:     Mental Status: She is alert and oriented to person, place, and time. Mental status is at baseline.      Sensory: No sensory deficit.     Motor: No weakness.  Psychiatric:        Mood and Affect: Mood normal.        Behavior: Behavior normal.         Assessment & Plan   1. Primary osteoarthritis of both knees - patient with known OA of b/l knees but more symptomatic in the right knee -She is done well previously with intra-articular corticosteroid injection - She would like this repeated today-see procedure note below -Discussed importance of ice and elevation as well as quad strengthening -Return precautions discussed - lidocaine (PF) (XYLOCAINE) 1 % injection 4 mL - methylPREDNISolone acetate (DEPO-MEDROL) injection 80 mg   INJECTION: Patient was given informed consent. Appropriate time out was taken. Area prepped and draped in usual sterile fashion. 2 cc of Depo-Medrol 40 mg/ml plus 4 cc of 1% lidocaine without epinephrine was injected into the right knee using a(n) anterior lateral approach. The patient tolerated the procedure well. There were no complications. Post procedure instructions were given.   Return in about 5 months (around 07/29/2018) for Knee arthritis f/u.   The entirety of the information documented in the History of Present Illness, Review of Systems and Physical Exam were personally obtained by me. Portions of this information were initially documented by Tiburcio Pea, CMA and reviewed by me for thoroughness and accuracy.    Virginia Crews, MD, MPH [facility] 03/01/2018 1:01 PM

## 2018-02-28 NOTE — Patient Instructions (Signed)

## 2018-03-26 ENCOUNTER — Encounter: Payer: Self-pay | Admitting: Family Medicine

## 2018-07-29 ENCOUNTER — Ambulatory Visit: Payer: 59 | Admitting: Family Medicine

## 2018-08-07 ENCOUNTER — Encounter: Payer: 59 | Admitting: Family Medicine

## 2018-08-12 ENCOUNTER — Ambulatory Visit: Payer: Self-pay | Admitting: Family Medicine

## 2018-09-04 ENCOUNTER — Ambulatory Visit: Payer: 59 | Admitting: Family Medicine

## 2018-10-04 ENCOUNTER — Ambulatory Visit: Payer: 59 | Admitting: Family Medicine

## 2018-10-18 ENCOUNTER — Encounter: Payer: 59 | Admitting: Family Medicine

## 2019-04-07 ENCOUNTER — Other Ambulatory Visit: Payer: Self-pay | Admitting: Family Medicine

## 2019-04-07 DIAGNOSIS — Z1231 Encounter for screening mammogram for malignant neoplasm of breast: Secondary | ICD-10-CM

## 2019-04-09 ENCOUNTER — Ambulatory Visit
Admission: RE | Admit: 2019-04-09 | Discharge: 2019-04-09 | Disposition: A | Discharge status: Home / Self Care | Payer: 59 | Source: Ambulatory Visit | Attending: Family Medicine | Admitting: Family Medicine

## 2019-04-09 DIAGNOSIS — Z1231 Encounter for screening mammogram for malignant neoplasm of breast: Secondary | ICD-10-CM | POA: Diagnosis present

## 2019-04-14 ENCOUNTER — Encounter: Payer: Self-pay | Admitting: Family Medicine

## 2019-04-14 ENCOUNTER — Other Ambulatory Visit: Payer: Self-pay

## 2019-04-14 ENCOUNTER — Telehealth: Payer: Self-pay

## 2019-04-14 ENCOUNTER — Ambulatory Visit: Payer: 59 | Admitting: Family Medicine

## 2019-04-14 VITALS — BP 167/96 | HR 99 | Temp 97.3°F | Resp 16 | Ht 62.0 in | Wt 241.0 lb

## 2019-04-14 DIAGNOSIS — M170 Bilateral primary osteoarthritis of knee: Secondary | ICD-10-CM | POA: Diagnosis not present

## 2019-04-14 MED ORDER — METHYLPREDNISOLONE ACETATE 40 MG/ML IJ SUSP: 40.0000 mg | Freq: Once | INTRAMUSCULAR | Status: DC

## 2019-04-14 MED ORDER — LIDOCAINE HCL (PF) 1 % IJ SOLN: 40.0000 mg | Freq: Once | INTRAMUSCULAR | Status: DC

## 2019-04-14 NOTE — Telephone Encounter (Signed)
LMTCB 04/14/2019.  PEC please advise pt of mammogram results below.   Thanks,   -Mickel Baas

## 2019-04-14 NOTE — Assessment & Plan Note (Addendum)
Pt with a history of OA in her knees bilaterally Pt is most symptomatic in right knee Pt does well with steroid injections  Plan: Will inject knee today with steroid injection - see procedure note Encourage exercise and staying active NSAIDs prn

## 2019-04-14 NOTE — Telephone Encounter (Signed)
Patient advised as below.  

## 2019-04-14 NOTE — Progress Notes (Addendum)
Established patient visit      Patient: Kendra Kim   DOB: 1970/02/24   49 y.o. Female  MRN: [id number] Visit Date: 04/16/2019  Today's healthcare provider: Lavon Paganini, MD  Subjective:    Chief Complaint  Patient presents with   Knee Pain   HPI  Follow up for Knee Pain  Right knee pain for 3-4 years Pt states that she has gotten 6-7 steroid injections; 1 injection every 5 months Pt states that injections are helpful to alleviate the pain Pt states last injection was March 2020 Pt with a history of knee arthritis Pt describes the pain as burning Symptoms are present when walking and worse with steps Pt states pain is better in the morning and worse and achy at night Pt states that when standing up from sitting, she takes a minute to get going again    ------------------------------------------------------------------------------------     Social History   Tobacco Use   Smoking status: Never Smoker   Smokeless tobacco: Never Used  Substance Use Topics   Alcohol use: Yes    Comment: social   Drug use: Never   Social History   Socioeconomic History   Marital status: Married    Spouse name: Gerald Stabs   Number of children: 3   Years of education: 14   Highest education level: Associate degree: academic program  Occupational History    Employer: [employer]  Tobacco Use   Smoking status: Never Smoker   Smokeless tobacco: Never Used  Substance and Sexual Activity   Alcohol use: Yes    Comment: social   Drug use: Never   Sexual activity: Yes    Partners: Male    Birth control/protection: Surgical  Other Topics Concern   Not on file  Social History Narrative   Not on file   Social Determinants of Health   Financial Resource Strain:    Difficulty of Paying Living Expenses:   Food Insecurity:    Worried About Charity fundraiser in the Last Year:    Arboriculturist in the Last Year:   Transportation Needs:    Lexicographer (Medical):    Lack of Transportation (Non-Medical):   Physical Activity:    Days of Exercise per Week:    Minutes of Exercise per Session:   Stress:    Feeling of Stress :   Social Connections:    Frequency of Communication with Friends and Family:    Frequency of Social Gatherings with Friends and Family:    Attends Religious Services:    Active Member of Clubs or Organizations:    Attends Music therapist:    Marital Status:   Intimate Partner Violence:    Fear of Current or Ex-Partner:    Emotionally Abused:    Physically Abused:    Sexually Abused:        Medications: Outpatient Medications Prior to Visit  Medication Sig   BIOTIN PO Take by mouth.   Caffeine-Magnesium Salicylate (DIUREX PO) Take 1 tablet 2 (two) times daily by mouth.   cyclobenzaprine (FLEXERIL) 5 MG tablet Take 1 tablet (5 mg total) by mouth 3 (three) times daily as needed for muscle spasms.   fluticasone (FLONASE) 50 MCG/ACT nasal spray Place into both nostrils daily.   Multiple Vitamin (MULTIVITAMIN) capsule Take 1 capsule daily by mouth.   Probiotic Product (PROBIOTIC DAILY PO) Take by mouth.   No facility-administered medications prior to visit.    Review of  Systems  Constitutional: Negative.   HENT: Negative.   Eyes: Negative.   Respiratory: Negative.   Cardiovascular: Negative.   Gastrointestinal: Negative.   Endocrine: Negative.   Genitourinary: Negative.   Musculoskeletal: Positive for arthralgias and joint swelling.  Skin: Negative.   Allergic/Immunologic: Negative.   Neurological: Negative.   Hematological: Negative.   Psychiatric/Behavioral: Negative.          Objective:    BP (!) 167/96    Pulse 99    Temp (!) 97.3 F (36.3 C) (Temporal)    Resp 16    Ht 5\' 2"  (1.575 m)    Wt 241 lb (109.3 kg)    LMP 03/28/2019    BMI 44.08 kg/m  BP Readings from Last 3 Encounters:  04/14/19 (!) 167/96  02/28/18 139/86  01/10/18 (!) 147/91     Wt Readings from Last 3 Encounters:  04/14/19 241 lb (109.3 kg)  02/28/18 225 lb 12.8 oz (102.4 kg)  01/10/18 223 lb 9.6 oz (101.4 kg)      Physical Exam Constitutional:      Appearance: Normal appearance.  HENT:     Head: Normocephalic and atraumatic.  Cardiovascular:     Rate and Rhythm: Normal rate and regular rhythm.     Pulses: Normal pulses.     Heart sounds: Normal heart sounds.  Pulmonary:     Effort: Pulmonary effort is normal.     Breath sounds: Normal breath sounds.  Abdominal:     Palpations: Abdomen is soft.  Musculoskeletal:     Cervical back: Neck supple.     Right knee: Swelling and effusion present. No bony tenderness or crepitus. No tenderness. No LCL laxity, MCL laxity, ACL laxity or PCL laxity.     Instability Tests: Negative medial McMurray test and negative lateral McMurray test.     Left knee: No swelling, effusion, bony tenderness or crepitus. No tenderness. No LCL laxity, MCL laxity, ACL laxity or PCL laxity.    Instability Tests: Negative medial McMurray test and negative lateral McMurray test.  Skin:    General: Skin is warm and dry.  Neurological:     General: No focal deficit present.     Mental Status: She is alert and oriented to person, place, and time.  Psychiatric:        Mood and Affect: Mood normal.        Behavior: Behavior normal.      No results found for any visits on 04/14/19.    Assessment & Plan:    Problem List Items Addressed This Visit      Musculoskeletal and Integument   Knee osteoarthritis - Primary    Pt with a history of OA in her knees bilaterally Pt is most symptomatic in right knee Pt does well with steroid injections  Plan: Will inject knee today with steroid injection - see procedure note Encourage exercise and staying active NSAIDs prn            Chipper Herb, [place] [phone] (phone) [phone] (fax)  [place]

## 2019-04-14 NOTE — Telephone Encounter (Signed)
-----   Message from Virginia Crews, MD sent at 04/14/2019  9:38 AM EDT ----- Normal mammogram. Repeat in 1 yr

## 2019-04-15 MED ORDER — LIDOCAINE HCL (PF) 1 % IJ SOLN
4.0000 mL | Freq: Once | INTRAMUSCULAR | Status: AC
  Administered 2019-04-14: 4 mL

## 2019-04-15 MED ORDER — METHYLPREDNISOLONE ACETATE 40 MG/ML IJ SUSP
80.0000 mg | Freq: Once | INTRAMUSCULAR | Status: AC
  Administered 2019-04-14: 80 mg via INTRA_ARTICULAR

## 2019-04-15 MED ORDER — METHYLPREDNISOLONE ACETATE 40 MG/ML IJ SUSP: 80.0000 mg | Freq: Once | INTRAMUSCULAR | Status: DC

## 2019-04-25 NOTE — Progress Notes (Signed)
Complete physical exam   Patient: Kendra Kim   DOB: [birth year]   49 y.o. Female  MRN: [id number] Visit Date: 04/30/2019  Today's healthcare provider: Lavon Paganini, MD   Chief Complaint  Patient presents with  . Annual Exam   Subjective    ITZABELLA Kim is a 49 y.o. female who presents today for a complete physical exam.  She reports consuming a general diet. The patient does not participate in regular exercise at present. She generally feels well. She reports sleeping well. She does not have additional problems to discuss today.  HPI  03/22/2017 CPE 03/22/2017 Pap/HPV-negative 04/09/2019 Mammogram-BI-RADS 1  History reviewed. No pertinent past medical history. Past Surgical History:  Procedure Laterality Date  . FOOT SURGERY Right    x 2, realigned 2nd toe and then fused, by podiatrist (Dr Paulla Dolly)  . TUBAL LIGATION  2005   Social History   Socioeconomic History  . Marital status: Married    Spouse name: Gerald Stabs  . Number of children: 3  . Years of education: 85  . Highest education level: Associate degree: academic program  Occupational History    Employer: [employer]  Tobacco Use  . Smoking status: Never Smoker  . Smokeless tobacco: Never Used  Substance and Sexual Activity  . Alcohol use: Yes    Comment: social  . Drug use: Never  . Sexual activity: Yes    Partners: Male    Birth control/protection: Surgical  Other Topics Concern  . Not on file  Social History Narrative  . Not on file   Social Determinants of Health   Financial Resource Strain:   . Difficulty of Paying Living Expenses:   Food Insecurity:   . Worried About Charity fundraiser in the Last Year:   . Arboriculturist in the Last Year:   Transportation Needs:   . Film/video editor (Medical):   Marland Kitchen Lack of Transportation (Non-Medical):   Physical Activity:   . Days of Exercise per Week:   . Minutes of Exercise per Session:   Stress:   . Feeling of Stress :   Social  Connections:   . Frequency of Communication with Friends and Family:   . Frequency of Social Gatherings with Friends and Family:   . Attends Religious Services:   . Active Member of Clubs or Organizations:   . Attends Archivist Meetings:   Marland Kitchen Marital Status:   Intimate Partner Violence:   . Fear of Current or Ex-Partner:   . Emotionally Abused:   Marland Kitchen Physically Abused:   . Sexually Abused:    Family Status  Relation Name Status  . Mother  Alive  . Father  Alive  . Sister  Alive  . MGM  (Not Specified)  . PGM  (Not Specified)  . PGF  (Not Specified)   Family History  Problem Relation Age of Onset  . Epilepsy Mother   . Osteoporosis Mother   . Colon cancer Mother 105  . Macular degeneration Mother   . Deep vein thrombosis Mother        after ortho injury  . Cancer Mother        Multiple Myeloma  . Hypertension Father   . Glaucoma Father   . Bipolar disorder Sister   . Lymphoma Sister   . Breast cancer Maternal Grandmother 80  . Diabetes Paternal Grandmother   . Diabetes Paternal Grandfather    No Known Allergies  Patient Care Team: [place],  Dionne Bucy, MD as PCP - General (Family Medicine)   Medications: Outpatient Medications Prior to Visit  Medication Sig  . BIOTIN PO Take by mouth.  . Caffeine-Magnesium Salicylate (DIUREX PO) Take 1 tablet 2 (two) times daily by mouth.  . fluticasone (FLONASE) 50 MCG/ACT nasal spray Place into both nostrils daily.  . Multiple Vitamin (MULTIVITAMIN) capsule Take 1 capsule daily by mouth.  . Probiotic Product (PROBIOTIC DAILY PO) Take by mouth.  . cyclobenzaprine (FLEXERIL) 5 MG tablet Take 1 tablet (5 mg total) by mouth 3 (three) times daily as needed for muscle spasms. (Patient not taking: Reported on 04/29/2019)   No facility-administered medications prior to visit.    Review of Systems  Constitutional: Negative.   HENT: Negative.   Eyes: Negative.   Respiratory: Negative.   Cardiovascular: Negative.     Gastrointestinal: Negative.   Endocrine: Negative.   Genitourinary: Negative.   Musculoskeletal: Negative.   Skin: Negative.   Allergic/Immunologic: Negative.   Neurological: Negative.   Hematological: Negative.   Psychiatric/Behavioral: Negative.       Objective    BP 140/88 (BP Location: Left Arm, Patient Position: Sitting, Cuff Size: Large)   Pulse 72   Temp (!) 97.3 F (36.3 C) (Temporal)   Resp 16   Ht '5\' 1"'$  (1.549 m)   Wt 240 lb 3.2 oz (109 kg)   LMP 04/23/2019   BMI 45.39 kg/m    Physical Exam Vitals reviewed.  Constitutional:      General: She is not in acute distress.    Appearance: Normal appearance. She is well-developed. She is not diaphoretic.  HENT:     Head: Normocephalic and atraumatic.     Right Ear: Tympanic membrane, ear canal and external ear normal.     Left Ear: Tympanic membrane, ear canal and external ear normal.  Eyes:     General: No scleral icterus.    Conjunctiva/sclera: Conjunctivae normal.     Pupils: Pupils are equal, round, and reactive to light.  Neck:     Thyroid: No thyromegaly.  Cardiovascular:     Rate and Rhythm: Normal rate and regular rhythm.     Heart sounds: Normal heart sounds. No murmur.  Pulmonary:     Effort: Pulmonary effort is normal. No respiratory distress.     Breath sounds: Normal breath sounds. No wheezing or rales.  Abdominal:     General: There is no distension.     Palpations: Abdomen is soft.     Tenderness: There is no abdominal tenderness. There is no guarding or rebound.  Musculoskeletal:        General: No deformity.     Cervical back: Neck supple.     Right lower leg: Edema present.     Left lower leg: Edema present.  Lymphadenopathy:     Cervical: No cervical adenopathy.  Skin:    General: Skin is warm and dry.     Findings: No rash.  Neurological:     Mental Status: She is alert and oriented to person, place, and time. Mental status is at baseline.     Gait: Gait normal.  Psychiatric:         Mood and Affect: Mood normal.        Behavior: Behavior normal.        Thought Content: Thought content normal.     Depression Screen  PHQ 2/9 Scores 04/29/2019 05/21/2018 11/08/2016  PHQ - 2 Score 0 0 0    Results for orders placed  or performed in visit on 04/29/19  Comprehensive metabolic panel  Result Value Ref Range   Glucose 83 65 - 99 mg/dL   BUN 14 6 - 24 mg/dL   Creatinine, Ser 0.79 0.57 - 1.00 mg/dL   GFR calc non Af Amer 88 >59 mL/min/1.73   GFR calc Af Amer 102 >59 mL/min/1.73   BUN/Creatinine Ratio 18 9 - 23   Sodium 139 134 - 144 mmol/L   Potassium 5.0 3.5 - 5.2 mmol/L   Chloride 105 96 - 106 mmol/L   CO2 22 20 - 29 mmol/L   Calcium 9.2 8.7 - 10.2 mg/dL   Total Protein 7.3 6.0 - 8.5 g/dL   Albumin 4.1 3.8 - 4.8 g/dL   Globulin, Total 3.2 1.5 - 4.5 g/dL   Albumin/Globulin Ratio 1.3 1.2 - 2.2   Bilirubin Total 0.2 0.0 - 1.2 mg/dL   Alkaline Phosphatase 121 (H) 39 - 117 IU/L   AST 16 0 - 40 IU/L   ALT 17 0 - 32 IU/L  Lipid panel  Result Value Ref Range   Cholesterol, Total 179 100 - 199 mg/dL   Triglycerides 98 0 - 149 mg/dL   HDL 56 >39 mg/dL   VLDL Cholesterol Cal 18 5 - 40 mg/dL   LDL Chol Calc (NIH) 105 (H) 0 - 99 mg/dL   Chol/HDL Ratio 3.2 0.0 - 4.4 ratio  TSH  Result Value Ref Range   TSH 1.750 0.450 - 4.500 uIU/mL  Hemoglobin A1c  Result Value Ref Range   Hgb A1c MFr Bld 5.6 4.8 - 5.6 %   Est. average glucose Bld gHb Est-mCnc 114 mg/dL  CBC w/Diff/Platelet  Result Value Ref Range   WBC 6.1 3.4 - 10.8 x10E3/uL   RBC 4.50 3.77 - 5.28 x10E6/uL   Hemoglobin 13.5 11.1 - 15.9 g/dL   Hematocrit 40.0 34.0 - 46.6 %   MCV 89 79 - 97 fL   MCH 30.0 26.6 - 33.0 pg   MCHC 33.8 31.5 - 35.7 g/dL   RDW 12.8 11.7 - 15.4 %   Platelets 360 150 - 450 x10E3/uL   Neutrophils 42 Not Estab. %   Lymphs 42 Not Estab. %   Monocytes 11 Not Estab. %   Eos 4 Not Estab. %   Basos 1 Not Estab. %   Neutrophils Absolute 2.6 1.4 - 7.0 x10E3/uL   Lymphocytes Absolute  2.6 0.7 - 3.1 x10E3/uL   Monocytes Absolute 0.7 0.1 - 0.9 x10E3/uL   EOS (ABSOLUTE) 0.2 0.0 - 0.4 x10E3/uL   Basophils Absolute 0.0 0.0 - 0.2 x10E3/uL   Immature Granulocytes 0 Not Estab. %   Immature Grans (Abs) 0.0 0.0 - 0.1 x10E3/uL   Hematology Comments: Note:   SAR CoV2 Serology (COVID 19)AB(IGG)IA  Result Value Ref Range   DiaSorin SARS-CoV-2 Ab, IgG Negative Negative    Assessment & Plan    Routine Health Maintenance and Physical Exam  Exercise Activities and Dietary recommendations Goals   None     Immunization History  Administered Date(s) Administered  . Tdap 11/08/2016    Health Maintenance  Topic Date Due  . COVID-19 Vaccine (1) 05/15/2019 (Originally 01/07/1986)  . INFLUENZA VACCINE  08/03/2019  . PAP SMEAR-Modifier  03/23/2022  . TETANUS/TDAP  11/09/2026  . HIV Screening  Completed    Discussed health benefits of physical activity, and encouraged her to engage in regular exercise appropriate for her age and condition.  Problem List Items Addressed This Visit  Other   Morbid obesity ([place])    Discussed importance of healthy weight management Discussed diet and exercise Discussed medication options for help with weight loss Did not tolerate phentermine in the past Will try Contrave - samples given      Relevant Medications   Naltrexone-buPROPion HCl ER (CONTRAVE) 8-90 MG TB12   Other Relevant Orders   Comprehensive metabolic panel (Completed)   Lipid panel (Completed)   TSH (Completed)   Hemoglobin A1c (Completed)   CBC w/Diff/Platelet (Completed)    Other Visit Diagnoses    Routine general medical examination at a health care facility    -  Primary   Relevant Orders   Comprehensive metabolic panel (Completed)   Lipid panel (Completed)   TSH (Completed)   Hemoglobin A1c (Completed)   CBC w/Diff/Platelet (Completed)   Family history of colon cancer       Relevant Orders   Ambulatory referral to Gastroenterology   COVID-19 virus  vaccination not done       Relevant Orders   SAR CoV2 Serology (COVID 19)AB(IGG)IA (Completed)       Return in about 3 months (around 07/29/2019) for obesity f/u.     I, Lavon Paganini, MD, have reviewed all documentation for this visit. The documentation on 04/30/19 for the exam, diagnosis, procedures, and orders are all accurate and complete.   Theora Vankirk, Dionne Bucy, MD, MPH [facility]

## 2019-04-29 ENCOUNTER — Ambulatory Visit (INDEPENDENT_AMBULATORY_CARE_PROVIDER_SITE_OTHER): Payer: 59 | Admitting: Family Medicine

## 2019-04-29 ENCOUNTER — Other Ambulatory Visit: Payer: Self-pay

## 2019-04-29 ENCOUNTER — Encounter: Payer: Self-pay | Admitting: Family Medicine

## 2019-04-29 VITALS — BP 140/88 | HR 72 | Temp 97.3°F | Resp 16 | Ht 61.0 in | Wt 240.2 lb

## 2019-04-29 DIAGNOSIS — Z289 Immunization not carried out for unspecified reason: Secondary | ICD-10-CM

## 2019-04-29 DIAGNOSIS — Z800 Family history of malignant neoplasm of digestive organs: Secondary | ICD-10-CM

## 2019-04-29 DIAGNOSIS — Z0000 Encounter for general adult medical examination without abnormal findings: Secondary | ICD-10-CM | POA: Diagnosis not present

## 2019-04-29 MED ORDER — CONTRAVE 8-90 MG PO TB12: ORAL_TABLET | ORAL | 2 refills | Status: DC

## 2019-04-29 NOTE — Patient Instructions (Signed)

## 2019-04-30 ENCOUNTER — Telehealth: Payer: Self-pay

## 2019-04-30 LAB — LIPID PANEL
Chol/HDL Ratio: 3.2 ratio (ref 0.0–4.4)
Cholesterol, Total: 179 mg/dL (ref 100–199)
HDL: 56 mg/dL (ref >39)
LDL Chol Calc (NIH): 105 mg/dL — ABNORMAL HIGH (ref 0–99)
Triglycerides: 98 mg/dL (ref 0–149)
VLDL Cholesterol Cal: 18 mg/dL (ref 5–40)

## 2019-04-30 LAB — CBC WITH DIFFERENTIAL/PLATELET
Basophils Absolute: 0 10*3/uL (ref 0.0–0.2)
Basos: 1 %
EOS (ABSOLUTE): 0.2 10*3/uL (ref 0.0–0.4)
Eos: 4 %
Hematocrit: 40 % (ref 34.0–46.6)
Hemoglobin: 13.5 g/dL (ref 11.1–15.9)
Immature Grans (Abs): 0 10*3/uL (ref 0.0–0.1)
Immature Granulocytes: 0 %
Lymphocytes Absolute: 2.6 10*3/uL (ref 0.7–3.1)
Lymphs: 42 %
MCH: 30 pg (ref 26.6–33.0)
MCHC: 33.8 g/dL (ref 31.5–35.7)
MCV: 89 fL (ref 79–97)
Monocytes Absolute: 0.7 10*3/uL (ref 0.1–0.9)
Monocytes: 11 %
Neutrophils Absolute: 2.6 10*3/uL (ref 1.4–7.0)
Neutrophils: 42 %
Platelets: 360 10*3/uL (ref 150–450)
RBC: 4.5 x10E6/uL (ref 3.77–5.28)
RDW: 12.8 % (ref 11.7–15.4)
WBC: 6.1 10*3/uL (ref 3.4–10.8)

## 2019-04-30 LAB — COMPREHENSIVE METABOLIC PANEL
ALT: 17 IU/L (ref 0–32)
AST: 16 IU/L (ref 0–40)
Albumin/Globulin Ratio: 1.3 (ref 1.2–2.2)
Albumin: 4.1 g/dL (ref 3.8–4.8)
Alkaline Phosphatase: 121 IU/L — ABNORMAL HIGH (ref 39–117)
BUN/Creatinine Ratio: 18 (ref 9–23)
BUN: 14 mg/dL (ref 6–24)
Bilirubin Total: 0.2 mg/dL (ref 0.0–1.2)
CO2: 22 mmol/L (ref 20–29)
Calcium: 9.2 mg/dL (ref 8.7–10.2)
Chloride: 105 mmol/L (ref 96–106)
Creatinine, Ser: 0.79 mg/dL (ref 0.57–1.00)
GFR calc Af Amer: 102 mL/min/{1.73_m2} (ref >59)
GFR calc non Af Amer: 88 mL/min/{1.73_m2} (ref >59)
Globulin, Total: 3.2 g/dL (ref 1.5–4.5)
Glucose: 83 mg/dL (ref 65–99)
Potassium: 5 mmol/L (ref 3.5–5.2)
Sodium: 139 mmol/L (ref 134–144)
Total Protein: 7.3 g/dL (ref 6.0–8.5)

## 2019-04-30 LAB — HEMOGLOBIN A1C
Est. average glucose Bld gHb Est-mCnc: 114 mg/dL
Hgb A1c MFr Bld: 5.6 % (ref 4.8–5.6)

## 2019-04-30 LAB — TSH: TSH: 1.75 u[IU]/mL (ref 0.450–4.500)

## 2019-04-30 LAB — SAR COV2 SEROLOGY (COVID19)AB(IGG),IA: DiaSorin SARS-CoV-2 Ab, IgG: NEGATIVE

## 2019-04-30 NOTE — Telephone Encounter (Signed)
-----   Message from Virginia Crews, MD sent at 04/30/2019  2:12 PM EDT ----- Normal labs

## 2019-04-30 NOTE — Assessment & Plan Note (Signed)
Discussed importance of healthy weight management Discussed diet and exercise Discussed medication options for help with weight loss Did not tolerate phentermine in the past Will try Contrave - samples given

## 2019-04-30 NOTE — Telephone Encounter (Signed)
LMTCB, PEC may give results. 

## 2019-05-01 NOTE — Telephone Encounter (Signed)
Phone call to pt.  Left message on personal vm that, per Dr. Brita Romp, her recent labs were normal.  Advised to call the office with any questions.

## 2019-05-02 NOTE — Telephone Encounter (Signed)
Patient advised as below.  

## 2019-05-05 ENCOUNTER — Other Ambulatory Visit: Payer: Self-pay

## 2019-05-05 ENCOUNTER — Other Ambulatory Visit (INDEPENDENT_AMBULATORY_CARE_PROVIDER_SITE_OTHER): Payer: Self-pay

## 2019-05-05 ENCOUNTER — Telehealth (INDEPENDENT_AMBULATORY_CARE_PROVIDER_SITE_OTHER): Payer: Self-pay | Admitting: Gastroenterology

## 2019-05-05 DIAGNOSIS — Z800 Family history of malignant neoplasm of digestive organs: Secondary | ICD-10-CM

## 2019-05-05 DIAGNOSIS — Z1211 Encounter for screening for malignant neoplasm of colon: Secondary | ICD-10-CM

## 2019-05-05 NOTE — Progress Notes (Signed)
Gastroenterology Pre-Procedure Review  Request Date: Thursday 06/12/19 Requesting Physician: Dr. Bonna Gains  PATIENT REVIEW QUESTIONS: The patient responded to the following health history questions as indicated:    1. Are you having any GI issues? no 2. Do you have a personal history of Polyps? no 3. Do you have a family history of Colon Cancer or Polyps? yes (mother colon cancer) 4. Diabetes Mellitus? no 5. Joint replacements in the past 12 months?no 6. Major health problems in the past 3 months?no 7. Any artificial heart valves, MVP, or defibrillator?no    MEDICATIONS & ALLERGIES:    Patient reports the following regarding taking any anticoagulation/antiplatelet therapy:   Plavix, Coumadin, Eliquis, Xarelto, Lovenox, Pradaxa, Brilinta, or Effient? no Aspirin? no  Patient confirms/reports the following medications:  Current Outpatient Medications  Medication Sig Dispense Refill  . BIOTIN PO Take by mouth.    . fluticasone (FLONASE) 50 MCG/ACT nasal spray Place into both nostrils daily.    . Multiple Vitamin (MULTIVITAMIN) capsule Take 1 capsule daily by mouth.    . Probiotic Product (PROBIOTIC DAILY PO) Take by mouth.    . Caffeine-Magnesium Salicylate (DIUREX PO) Take 1 tablet 2 (two) times daily by mouth.    . cyclobenzaprine (FLEXERIL) 5 MG tablet Take 1 tablet (5 mg total) by mouth 3 (three) times daily as needed for muscle spasms. (Patient not taking: Reported on 04/29/2019) 30 tablet 1  . Naltrexone-buPROPion HCl ER (CONTRAVE) 8-90 MG TB12 Start 1 tablet every morning for 7 days, then 1 tablet twice daily for 7 days, then 2 tablets every morning and one every evening (Patient not taking: Reported on 05/05/2019) 120 tablet 2   No current facility-administered medications for this visit.    Patient confirms/reports the following allergies:  No Known Allergies  No orders of the defined types were placed in this encounter.   AUTHORIZATION INFORMATION Primary  Insurance: 1D#: Group #:  Secondary Insurance: 1D#: Group #:  SCHEDULE INFORMATION: Date: Thursday 06/12/19 Time: Location:[facility]

## 2019-05-09 ENCOUNTER — Encounter: Payer: Self-pay | Admitting: Family Medicine

## 2019-06-04 ENCOUNTER — Other Ambulatory Visit: Payer: Self-pay

## 2019-06-04 DIAGNOSIS — Z1211 Encounter for screening for malignant neoplasm of colon: Secondary | ICD-10-CM

## 2019-06-04 DIAGNOSIS — Z800 Family history of malignant neoplasm of digestive organs: Secondary | ICD-10-CM

## 2019-06-04 MED ORDER — NA SULFATE-K SULFATE-MG SULF 17.5-3.13-1.6 GM/177ML PO SOLN: 1.0000 | Freq: Once | ORAL | 0 refills | Status: AC

## 2019-06-10 ENCOUNTER — Other Ambulatory Visit
Admission: RE | Admit: 2019-06-10 | Discharge: 2019-06-10 | Disposition: A | Discharge status: Home / Self Care | Payer: 59 | Source: Ambulatory Visit | Attending: Gastroenterology | Admitting: Gastroenterology

## 2019-06-10 ENCOUNTER — Other Ambulatory Visit: Payer: Self-pay

## 2019-06-10 DIAGNOSIS — Z01812 Encounter for preprocedural laboratory examination: Secondary | ICD-10-CM | POA: Diagnosis not present

## 2019-06-10 DIAGNOSIS — Z20822 Contact with and (suspected) exposure to covid-19: Secondary | ICD-10-CM | POA: Diagnosis not present

## 2019-06-11 ENCOUNTER — Encounter: Payer: Self-pay | Admitting: Gastroenterology

## 2019-06-11 LAB — SARS CORONAVIRUS 2 (TAT 6-24 HRS): SARS Coronavirus 2: NEGATIVE

## 2019-06-12 ENCOUNTER — Encounter
Admission: RE | Disposition: A | Discharge status: Home / Self Care | Payer: Self-pay | Source: Home / Self Care | Attending: Gastroenterology

## 2019-06-12 ENCOUNTER — Ambulatory Visit
Admission: RE | Admit: 2019-06-12 | Discharge: 2019-06-12 | Disposition: A | Discharge status: Home / Self Care | Payer: 59 | Attending: Gastroenterology | Admitting: Gastroenterology

## 2019-06-12 ENCOUNTER — Ambulatory Visit: Payer: 59 | Admitting: Anesthesiology

## 2019-06-12 ENCOUNTER — Encounter: Payer: Self-pay | Admitting: Gastroenterology

## 2019-06-12 ENCOUNTER — Other Ambulatory Visit: Payer: Self-pay

## 2019-06-12 DIAGNOSIS — Z79899 Other long term (current) drug therapy: Secondary | ICD-10-CM | POA: Diagnosis not present

## 2019-06-12 DIAGNOSIS — K635 Polyp of colon: Secondary | ICD-10-CM | POA: Diagnosis not present

## 2019-06-12 DIAGNOSIS — D123 Benign neoplasm of transverse colon: Secondary | ICD-10-CM | POA: Insufficient documentation

## 2019-06-12 DIAGNOSIS — Z800 Family history of malignant neoplasm of digestive organs: Secondary | ICD-10-CM | POA: Diagnosis not present

## 2019-06-12 DIAGNOSIS — Z6841 Body Mass Index (BMI) 40.0 and over, adult: Secondary | ICD-10-CM | POA: Insufficient documentation

## 2019-06-12 DIAGNOSIS — Z1211 Encounter for screening for malignant neoplasm of colon: Secondary | ICD-10-CM | POA: Diagnosis present

## 2019-06-12 HISTORY — PX: COLONOSCOPY WITH PROPOFOL: SHX5780

## 2019-06-12 LAB — POCT PREGNANCY, URINE: Preg Test, Ur: NEGATIVE

## 2019-06-12 SURGERY — COLONOSCOPY WITH PROPOFOL
Anesthesia: General

## 2019-06-12 MED ORDER — PROPOFOL 500 MG/50ML IV EMUL
INTRAVENOUS | Status: DC | PRN
  Administered 2019-06-12: 120 ug/kg/min via INTRAVENOUS

## 2019-06-12 MED ORDER — PROPOFOL 10 MG/ML IV BOLUS
INTRAVENOUS | Status: DC | PRN
  Administered 2019-06-12: 30 mg via INTRAVENOUS
  Administered 2019-06-12: 80 mg via INTRAVENOUS

## 2019-06-12 MED ORDER — SODIUM CHLORIDE 0.9 % IV SOLN
INTRAVENOUS | Status: DC
  Administered 2019-06-12: 1000 mL via INTRAVENOUS

## 2019-06-12 MED ORDER — LIDOCAINE HCL (CARDIAC) PF 100 MG/5ML IV SOSY
PREFILLED_SYRINGE | INTRAVENOUS | Status: DC | PRN
  Administered 2019-06-12: 50 mg via INTRAVENOUS

## 2019-06-12 NOTE — Transfer of Care (Signed)
Immediate Anesthesia Transfer of Care Note  Patient: Kendra Kim  Procedure(s) Performed: COLONOSCOPY WITH PROPOFOL (N/A )  Patient Location: Endoscopy Unit  Anesthesia Type:General  Level of Consciousness: drowsy and patient cooperative  Airway & Oxygen Therapy: Patient Spontanous Breathing  Post-op Assessment: Report given to RN and Post -op Vital signs reviewed and stable  Post vital signs: Reviewed and stable  Last Vitals:  Vitals Value Taken Time  BP 148/85 06/12/19 1102  Temp    Pulse 83 06/12/19 1102  Resp 23 06/12/19 1102  SpO2 100 % 06/12/19 1102  Vitals shown include unvalidated device data.  Last Pain:  Vitals:   06/12/19 1014  TempSrc: Tympanic  PainSc: 0-No pain         Complications: No complications documented.

## 2019-06-12 NOTE — Op Note (Signed)
[facility] Gastroenterology Patient Name: Kendra Kim Procedure Date: 06/12/2019 10:33 AM MRN: [id number] Account #: [id number] Date of Birth: 12-29-1970 Admit Type: Outpatient Age: 49 Room: [facility] ENDO ROOM 3 Gender: Female Note Status: Finalized Procedure:             Colonoscopy Indications:           Screening in patient at increased risk: Colorectal                         cancer in mother 76 or older Providers:             Drayton Tieu Raeanne Gathers MD, MD Referring MD:          Dionne Bucy. Bacigalupo (Referring MD) Medicines:             Monitored Anesthesia Care Complications:         No immediate complications. Estimated blood loss: None. Procedure:             Pre-Anesthesia Assessment:                        - Prior to the procedure, a History and Physical was                         performed, and patient medications and allergies were                         reviewed. The patient is competent. The risks and                         benefits of the procedure and the sedation options and                         risks were discussed with the patient. All questions                         were answered and informed consent was obtained.                         Patient identification and proposed procedure were                         verified by the physician, the nurse, the                         anesthesiologist, the anesthetist and the technician                         in the pre-procedure area in the procedure room in the                         endoscopy suite. Mental Status Examination: alert and                         oriented. Airway Examination: normal oropharyngeal                         airway and neck mobility. Respiratory Examination:  clear to auscultation. CV Examination: normal.                         Prophylactic Antibiotics: The patient does not require                         prophylactic antibiotics. Prior  Anticoagulants: The                         patient has taken no previous anticoagulant or                         antiplatelet agents. ASA Grade Assessment: III - A                         patient with severe systemic disease. After reviewing                         the risks and benefits, the patient was deemed in                         satisfactory condition to undergo the procedure. The                         anesthesia plan was to use monitored anesthesia care                         (MAC). Immediately prior to administration of                         medications, the patient was re-assessed for adequacy                         to receive sedatives. The heart rate, respiratory                         rate, oxygen saturations, blood pressure, adequacy of                         pulmonary ventilation, and response to care were                         monitored throughout the procedure. The physical                         status of the patient was re-assessed after the                         procedure.                        After obtaining informed consent, the colonoscope was                         passed under direct vision. Throughout the procedure,                         the patient's blood pressure, pulse, and oxygen  saturations were monitored continuously. The                         Colonoscope was introduced through the anus and                         advanced to the the cecum, identified by appendiceal                         orifice and ileocecal valve. The colonoscopy was                         performed without difficulty. The patient tolerated                         the procedure well. The quality of the bowel                         preparation was evaluated using the BBPS [facility] Bowel                         Preparation Scale) with scores of: Right Colon = 3,                         Transverse Colon = 3 and Left Colon = 3 (entire mucosa                          seen well with no residual staining, small fragments                         of stool or opaque liquid). The total BBPS score                         equals 9. Findings:      The perianal and digital rectal examinations were normal. Pertinent       negatives include normal sphincter tone and no palpable rectal lesions.      A 5 mm polyp was found in the transverse colon. The polyp was sessile.       The polyp was removed with a cold snare. Resection and retrieval were       complete.      The retroflexed view of the distal rectum and anal verge was normal and       showed no anal or rectal abnormalities.      The exam was otherwise without abnormality. Impression:            - One 5 mm polyp in the transverse colon, removed with                         a cold snare. Resected and retrieved.                        - The distal rectum and anal verge are normal on                         retroflexion view.                        -  The examination was otherwise normal. Recommendation:        - Discharge patient to home (with escort).                        - Resume previous diet today.                        - Continue present medications.                        - Await pathology results.                        - Repeat colonoscopy in 5 years for surveillance. Procedure Code(s):     --- Professional ---                        [id number], Colonoscopy, flexible; with removal of                         tumor(s), polyp(s), or other lesion(s) by snare                         technique Diagnosis Code(s):     --- Professional ---                        Z80.0, Family history of malignant neoplasm of                         digestive organs                        K63.5, Polyp of colon CPT copyright 2019 American Medical Association. All rights reserved. The codes documented in this report are preliminary and upon coder review may  be revised to meet current compliance  requirements. Dr. Ulyess Mort Lin Landsman MD, MD 06/12/2019 10:57:55 AM This report has been signed electronically. Number of Addenda: 0 Note Initiated On: 06/12/2019 10:33 AM Scope Withdrawal Time: 0 hours 5 minutes 40 seconds  Total Procedure Duration: 0 hours 8 minutes 4 seconds  Estimated Blood Loss:  Estimated blood loss: none.      [facility]

## 2019-06-12 NOTE — Anesthesia Preprocedure Evaluation (Signed)
Anesthesia Evaluation  Patient identified by MRN, date of birth, ID band Patient awake    Reviewed: Allergy & Precautions, H&P , NPO status , Patient's Chart, lab work & pertinent test results  History of Anesthesia Complications Negative for: history of anesthetic complications  Airway Mallampati: III  TM Distance: <3 FB Neck ROM: full    Dental  (+) Chipped   Pulmonary neg pulmonary ROS, neg shortness of breath,    Pulmonary exam normal        Cardiovascular (-) angina(-) Past MI and (-) DOE negative cardio ROS Normal cardiovascular exam     Neuro/Psych negative neurological ROS  negative psych ROS   GI/Hepatic negative GI ROS, Neg liver ROS, neg GERD  ,  Endo/Other  Morbid obesity  Renal/GU negative Renal ROS  negative genitourinary   Musculoskeletal   Abdominal   Peds  Hematology negative hematology ROS (+)   Anesthesia Other Findings History reviewed. No pertinent past medical history.  Past Surgical History: No date: FOOT SURGERY; Right     Comment:  x 2, realigned 2nd toe and then fused, by podiatrist (Dr              Paulla Dolly) 2005: TUBAL LIGATION  BMI    Body Mass Index: 43.46 kg/m      Reproductive/Obstetrics negative OB ROS                             Anesthesia Physical Anesthesia Plan  ASA: III  Anesthesia Plan: General   Post-op Pain Management:    Induction: Intravenous  PONV Risk Score and Plan: Propofol infusion and TIVA  Airway Management Planned: Natural Airway and Nasal Cannula  Additional Equipment:   Intra-op Plan:   Post-operative Plan:   Informed Consent: I have reviewed the patients History and Physical, chart, labs and discussed the procedure including the risks, benefits and alternatives for the proposed anesthesia with the patient or authorized representative who has indicated his/her understanding and acceptance.     Dental Advisory  Given  Plan Discussed with: Anesthesiologist, CRNA and Surgeon  Anesthesia Plan Comments: (Patient consented for risks of anesthesia including but not limited to:  - adverse reactions to medications - risk of intubation if required - damage to eyes, teeth, lips or other oral mucosa - nerve damage due to positioning  - sore throat or hoarseness - Damage to heart, brain, nerves, lungs, other parts of body or loss of life  Patient voiced understanding.)        Anesthesia Quick Evaluation

## 2019-06-12 NOTE — Anesthesia Postprocedure Evaluation (Signed)
Anesthesia Post Note  Patient: Kendra Kim  Procedure(s) Performed: COLONOSCOPY WITH PROPOFOL (N/A )  Patient location during evaluation: Endoscopy Anesthesia Type: General Level of consciousness: awake and alert Pain management: pain level controlled Vital Signs Assessment: post-procedure vital signs reviewed and stable Respiratory status: spontaneous breathing, nonlabored ventilation, respiratory function stable and patient connected to nasal cannula oxygen Cardiovascular status: blood pressure returned to baseline and stable Postop Assessment: no apparent nausea or vomiting Anesthetic complications: no   No complications documented.   Last Vitals:  Vitals:   06/12/19 1110 06/12/19 1130  BP: (!) 158/87 (!) 156/83  Pulse: 69 70  Resp: (!) 21 19  Temp:    SpO2: 99% 100%    Last Pain:  Vitals:   06/12/19 1014  TempSrc: Tympanic  PainSc: 0-No pain                 Precious Haws Jahzion Brogden

## 2019-06-12 NOTE — H&P (Signed)
Kendra Darby, MD [address]  [place]  [place], [place] [address]  Main: [phone]  Fax: [phone] Pager: [phone]  Primary Care Physician:  Kendra Crews, MD Primary Gastroenterologist:  Dr. Cephas Kim  Pre-Procedure History & Physical: HPI:  Kendra Kim is a 49 y.o. female is here for an colonoscopy.   History reviewed. No pertinent past medical history.  Past Surgical History:  Procedure Laterality Date  . FOOT SURGERY Right    x 2, realigned 2nd toe and then fused, by podiatrist (Dr Paulla Dolly)  . TUBAL LIGATION  2005    Prior to Admission medications   Medication Sig Start Date End Date Taking? Authorizing Provider  BIOTIN PO Take by mouth.    [provider]  Caffeine-Magnesium Salicylate (DIUREX PO) Take 1 tablet 2 (two) times daily by mouth.    [provider]  cyclobenzaprine (FLEXERIL) 5 MG tablet Take 1 tablet (5 mg total) by mouth 3 (three) times daily as needed for muscle spasms. Patient not taking: Reported on 04/29/2019 01/10/18   Kendra Crews, MD  fluticasone [facility]) 50 MCG/ACT nasal spray Place into both nostrils daily.    [provider]  Multiple Vitamin (MULTIVITAMIN) capsule Take 1 capsule daily by mouth.    [provider]  Naltrexone-buPROPion HCl ER (CONTRAVE) 8-90 MG TB12 Start 1 tablet every morning for 7 days, then 1 tablet twice daily for 7 days, then 2 tablets every morning and one every evening Patient not taking: Reported on 05/05/2019 04/29/19   Kendra Crews, MD  Probiotic Product (PROBIOTIC DAILY PO) Take by mouth.    [provider]    Allergies as of 05/05/2019  . (No Known Allergies)    Family History  Problem Relation Age of Onset  . Epilepsy Mother   . Osteoporosis Mother   . Colon cancer Mother 47  . Macular degeneration Mother   . Deep vein thrombosis Mother        after ortho injury  . Cancer Mother        Multiple Myeloma  . Hypertension  Father   . Glaucoma Father   . Bipolar disorder Sister   . Lymphoma Sister   . Breast cancer Maternal Grandmother 80  . Diabetes Paternal Grandmother   . Diabetes Paternal Grandfather     Social History   Socioeconomic History  . Marital status: Married    Spouse name: Gerald Stabs  . Number of children: 3  . Years of education: 41  . Highest education level: Associate degree: academic program  Occupational History    Employer: [employer]  Tobacco Use  . Smoking status: Never Smoker  . Smokeless tobacco: Never Used  Vaping Use  . Vaping Use: Never used  Substance and Sexual Activity  . Alcohol use: Yes    Comment: social  . Drug use: Never  . Sexual activity: Yes    Partners: Male    Birth control/protection: Surgical  Other Topics Concern  . Not on file  Social History Narrative  . Not on file   Social Determinants of Health   Financial Resource Strain:   . Difficulty of Paying Living Expenses:   Food Insecurity:   . Worried About Charity fundraiser in the Last Year:   . Arboriculturist in the Last Year:   Transportation Needs:   . Film/video editor (Medical):   Marland Kitchen Lack of Transportation (Non-Medical):   Physical Activity:   . Days  of Exercise per Week:   . Minutes of Exercise per Session:   Stress:   . Feeling of Stress :   Social Connections:   . Frequency of Communication with Friends and Family:   . Frequency of Social Gatherings with Friends and Family:   . Attends Religious Services:   . Active Member of Clubs or Organizations:   . Attends Archivist Meetings:   Marland Kitchen Marital Status:   Intimate Partner Violence:   . Fear of Current or Ex-Partner:   . Emotionally Abused:   Marland Kitchen Physically Abused:   . Sexually Abused:     Review of Systems: See HPI, otherwise negative ROS  Physical Exam: BP (!) 165/112   Pulse 81   Temp 97.7 F (36.5 C) (Tympanic)   Resp 16   Ht '5\' 1"'$  (1.549 m)   Wt 104.3 kg   SpO2 100%   BMI 43.46 kg/m  General:    Alert,  pleasant and cooperative in NAD Head:  Normocephalic and atraumatic. Neck:  Supple; no masses or thyromegaly. Lungs:  Clear throughout to auscultation.    Heart:  Regular rate and rhythm. Abdomen:  Soft, nontender and nondistended. Normal bowel sounds, without guarding, and without rebound.   Neurologic:  Alert and  oriented x4;  grossly normal neurologically.  Impression/Plan: Kendra Kim is here for an colonoscopy to be performed for colon cancer screening, family history of colon cancer  Risks, benefits, limitations, and alternatives regarding  colonoscopy have been reviewed with the patient.  Questions have been answered.  All parties agreeable.   Sherri Sear, MD  06/12/2019, 10:27 AM

## 2019-06-13 ENCOUNTER — Encounter: Payer: Self-pay | Admitting: Gastroenterology

## 2019-06-13 LAB — SURGICAL PATHOLOGY

## 2019-08-01 ENCOUNTER — Ambulatory Visit: Payer: Self-pay | Admitting: Family Medicine

## 2019-10-27 ENCOUNTER — Ambulatory Visit: Payer: 59 | Admitting: Family Medicine

## 2019-10-27 ENCOUNTER — Other Ambulatory Visit: Payer: Self-pay

## 2019-10-27 ENCOUNTER — Encounter: Payer: Self-pay | Admitting: Family Medicine

## 2019-10-27 VITALS — BP 133/82 | HR 86 | Temp 98.8°F | Wt 227.0 lb

## 2019-10-27 DIAGNOSIS — M170 Bilateral primary osteoarthritis of knee: Secondary | ICD-10-CM

## 2019-10-27 DIAGNOSIS — Z800 Family history of malignant neoplasm of digestive organs: Secondary | ICD-10-CM

## 2019-10-27 NOTE — Patient Instructions (Signed)
Knee Injection A knee injection is a procedure to get medicine into your knee joint to relieve the pain, swelling, and stiffness of arthritis. Your health care provider uses a needle to inject medicine, which may also help to lubricate and cushion your knee joint. You may need more than one injection. Tell a health care provider about:  Any allergies you have.  All medicines you are taking, including vitamins, herbs, eye drops, creams, and over-the-counter medicines.  Any problems you or family members have had with anesthetic medicines.  Any blood disorders you have.  Any surgeries you have had.  Any medical conditions you have.  Whether you are pregnant or may be pregnant. What are the risks? Generally, this is a safe procedure. However, problems may occur, including:  Infection.  Bleeding.  Symptoms that get worse.  Damage to the area around your knee.  Allergic reaction to any of the medicines.  Skin reactions from repeated injections. What happens before the procedure?  Ask your health care provider about changing or stopping your regular medicines. This is especially important if you are taking diabetes medicines or blood thinners.  Plan to have someone take you home from the hospital or clinic. What happens during the procedure?   You will sit or lie down in a position for your knee to be treated.  The skin over your kneecap will be cleaned with a germ-killing soap.  You will be given a medicine that numbs the area (local anesthetic). You may feel some stinging.  The medicine will be injected into your knee. The needle is carefully placed between your kneecap and your knee. The medicine is injected into the joint space.  The needle will be removed at the end of the procedure.  A bandage (dressing) may be placed over the injection site. The procedure may vary among health care providers and hospitals. What can I expect after the procedure?  Your blood  pressure, heart rate, breathing rate, and blood oxygen level will be monitored until you leave the hospital or clinic.  You may have to move your knee through its full range of motion. This helps to get all the medicine into your joint space.  You will be watched to make sure that you do not have a reaction to the injected medicine.  You may feel more pain, swelling, and warmth than you did before the injection. This reaction may last about 1-2 days. Follow these instructions at home: Medicines  Take over-the-counter and prescription medicines only as told by your doctor.  Do not drive or use heavy machinery while taking prescription pain medicine.  Do not take medicines such as aspirin and ibuprofen unless your health care provider tells you to take them. Injection site care  Follow instructions from your health care provider about: ? How to take care of your puncture site. ? When and how you should change your dressing. ? When you should remove your dressing.  Check your injection area every day for signs of infection. Check for: ? More redness, swelling, or pain after 2 days. ? Fluid or blood. ? Pus or a bad smell. ? Warmth. Managing pain, stiffness, and swelling   If directed, put ice on the injection area: ? Put ice in a plastic bag. ? Place a towel between your skin and the bag. ? Leave the ice on for 20 minutes, 2-3 times per day.  Do not apply heat to your knee.  Raise (elevate) the injection area above the level   of your heart while you are sitting or lying down. General instructions  If you were given a dressing, keep it dry until your health care provider says it can be removed. Ask your health care provider when you can start showering or taking a bath.  Avoid strenuous activities for as long as directed by your health care provider. Ask your health care provider when you can return to your normal activities.  Keep all follow-up visits as told by your health  care provider. This is important. You may need more injections. Contact a health care provider if you have:  A fever.  Warmth in your injection area.  Fluid, blood, or pus coming from your injection site.  Symptoms at your injection site that last longer than 2 days after your procedure. Get help right away if:  Your knee: ? Turns very red. ? Becomes very swollen. ? Is in severe pain. Summary  A knee injection is a procedure to get medicine into your knee joint to relieve the pain, swelling, and stiffness of arthritis.  A needle is carefully placed between your kneecap and your knee to inject medicine into the joint space.  Before the procedure, ask your health care provider about changing or stopping your regular medicines, especially if you are taking diabetes medicines or blood thinners.  Contact your health care provider if you have any problems or questions after your procedure. This information is not intended to replace advice given to you by your health care provider. Make sure you discuss any questions you have with your health care provider. Document Revised: 01/08/2017 Document Reviewed: 01/08/2017 Elsevier Patient Education  2020 Elsevier Inc[employer]  

## 2019-10-27 NOTE — Assessment & Plan Note (Signed)
Discussed importance of healthy diet Discussed diet, weight loss and exercise Set goals of 8000 steps per day, and 4x20 of curles, shoulder presses and triceps

## 2019-10-27 NOTE — Assessment & Plan Note (Addendum)
Continued worsening r knee pain, esp with walking and knee extension Counseled on diet interventions, and weight loss with increased walking and additional of mild weight training Cortisone injection today, Ibuprofen PRN, continue weight bearing exercise as tolerated. F/u in 5 months, unless pain continues

## 2019-10-27 NOTE — Assessment & Plan Note (Signed)
UTD on colonoscopies

## 2019-10-27 NOTE — Progress Notes (Signed)
Established patient visit   Patient: Kendra Kim   DOB: 02-14-1970   49 y.o. Female  MRN: [id number] Visit Date: 10/27/2019  Today's healthcare provider: Lavon Paganini, MD   Chief Complaint  Patient presents with  . Knee Pain   Subjective    HPI   Kendra Kim is a 49 y/o female with a pmh of chronic osteoarthritis who presents with worseing r knee pain since her last visit in April 2021. She has been unbale to walk up stairs for the last month and describes the pain as constant and contiuous throughout the day. She has some pain in he right calves and thighs that she attributes to compensation while walking.   Described her diet as a 8/10 healthy, continues to eat vegetables. Is expressing difficulty with exercise as it is limited by knee pain. Does no currently perform strength training, she had a personal trainer prior to [place] but has not seen them since.    Social History   Tobacco Use  . Smoking status: Never Smoker  . Smokeless tobacco: Never Used  Vaping Use  . Vaping Use: Never used  Substance Use Topics  . Alcohol use: Yes    Comment: social  . Drug use: Never       Medications: Outpatient Medications Prior to Visit  Medication Sig  . BIOTIN PO Take by mouth.  . fluticasone (FLONASE) 50 MCG/ACT nasal spray Place into both nostrils daily.  . Multiple Vitamin (MULTIVITAMIN) capsule Take 1 capsule daily by mouth.  . Probiotic Product (PROBIOTIC DAILY PO) Take by mouth.  . [DISCONTINUED] Caffeine-Magnesium Salicylate (DIUREX PO) Take 1 tablet 2 (two) times daily by mouth.  . [DISCONTINUED] cyclobenzaprine (FLEXERIL) 5 MG tablet Take 1 tablet (5 mg total) by mouth 3 (three) times daily as needed for muscle spasms. (Patient not taking: Reported on 04/29/2019)  . [DISCONTINUED] Naltrexone-buPROPion HCl ER (CONTRAVE) 8-90 MG TB12 Start 1 tablet every morning for 7 days, then 1 tablet twice daily for 7 days, then 2 tablets every morning and one every  evening (Patient not taking: Reported on 05/05/2019)   No facility-administered medications prior to visit.    Review of Systems  Constitutional: Negative.   HENT: Negative.   Eyes: Negative.   Respiratory: Negative.   Cardiovascular: Negative.   Gastrointestinal: Negative.   Endocrine: Negative.   Genitourinary: Negative.   Musculoskeletal: Positive for arthralgias and joint swelling.  Skin: Negative.   Allergic/Immunologic: Negative.   Neurological: Negative.   Hematological: Negative.   Psychiatric/Behavioral: Negative.      Objective    BP 133/82 (BP Location: Left Arm, Patient Position: Sitting, Cuff Size: Large)   Pulse 86   Temp 98.8 F (37.1 C) (Oral)   Wt 227 lb (103 kg)   BMI 42.89 kg/m     Physical Exam  General: Well appearing female, pleasant affect and conversation Lungs: CTA Bilaterally Heart: RRR no murmurs rubs or gallops Abdomen: Nontender, Non-distended  Extremities: R knee painful upon extension, normal flexion, 5/5 Strength, mildly tender to palpation along joint line, L Extremity Normal   No results found for any visits on 10/27/19.  Assessment & Plan     Problem List Items Addressed This Visit      Musculoskeletal and Integument   Knee osteoarthritis - Primary    Continued worsening r knee pain, esp with walking and knee extension Counseled on diet interventions, and weight loss with increased walking and additional of mild weight training Cortisone injection today,  Ibuprofen PRN, continue weight bearing exercise as tolerated. F/u in 5 months, unless pain continues        Other   Morbid obesity ([place])    Discussed importance of healthy diet Discussed diet, weight loss and exercise Set goals of 8000 steps per day, and 4x20 of curles, shoulder presses and triceps      Family history of colon cancer in mother    UTD on colonoscopies          INJECTION: Patient was given informed consent,. Appropriate time out was taken. Area  prepped and draped in usual sterile fashion. 2 cc of depo-medrol 40 mg/ml plus  4 cc of 1% lidocaine without epinephrine was injected into the R knee  using a(n) anterolateral approach. The patient tolerated the procedure well. There were no complications. Post procedure instructions were given.    Return in about 6 months (around 04/26/2020) for CPE, as scheduled.      Note was initiated by Stark Klein, MS3  Patient seen along with MS3 student [name]. I personally evaluated this patient along with the student, and verified all aspects of the history, physical exam, and medical decision making as documented by the student. I agree with the student's documentation and have made all necessary edits.  Lanique Gonzalo, Dionne Bucy, MD, MPH [facility]

## 2019-10-30 ENCOUNTER — Encounter: Payer: Self-pay | Admitting: Family Medicine

## 2019-10-30 DIAGNOSIS — M170 Bilateral primary osteoarthritis of knee: Secondary | ICD-10-CM

## 2019-11-06 NOTE — Telephone Encounter (Signed)
Can you place referral to ortho and let patient know when this has been done?

## 2020-04-30 NOTE — Patient Instructions (Addendum)
The CDC recommends two doses of Shingrix (the shingles vaccine) separated by 2 to 6 months for adults age 50 years and older. I recommend checking with your insurance plan regarding coverage for this vaccine.     Preventive Care 53-63 Years Old, Female Preventive care refers to lifestyle choices and visits with your health care provider that can promote health and wellness. This includes:  A yearly physical exam. This is also called an annual wellness visit.  Regular dental and eye exams.  Immunizations.  Screening for certain conditions.  Healthy lifestyle choices, such as: ? Eating a healthy diet. ? Getting regular exercise. ? Not using drugs or products that contain nicotine and tobacco. ? Limiting alcohol use. What can I expect for my preventive care visit? Physical exam Your health care provider will check your:  Height and weight. These may be used to calculate your BMI (body mass index). BMI is a measurement that tells if you are at a healthy weight.  Heart rate and blood pressure.  Body temperature.  Skin for abnormal spots. Counseling Your health care provider may ask you questions about your:  Past medical problems.  Family's medical history.  Alcohol, tobacco, and drug use.  Emotional well-being.  Home life and relationship well-being.  Sexual activity.  Diet, exercise, and sleep habits.  Work and work Statistician.  Access to firearms.  Method of birth control.  Menstrual cycle.  Pregnancy history. What immunizations do I need? Vaccines are usually given at various ages, according to a schedule. Your health care provider will recommend vaccines for you based on your age, medical history, and lifestyle or other factors, such as travel or where you work.   What tests do I need? Blood tests  Lipid and cholesterol levels. These may be checked every 5 years, or more often if you are over 19 years old.  Hepatitis C test.  Hepatitis B  test. Screening  Lung cancer screening. You may have this screening every year starting at age 28 if you have a 30-pack-year history of smoking and currently smoke or have quit within the past 15 years.  Colorectal cancer screening. ? All adults should have this screening starting at age 100 and continuing until age 36. ? Your health care provider may recommend screening at age 22 if you are at increased risk. ? You will have tests every 1-10 years, depending on your results and the type of screening test.  Diabetes screening. ? This is done by checking your blood sugar (glucose) after you have not eaten for a while (fasting). ? You may have this done every 1-3 years.  Mammogram. ? This may be done every 1-2 years. ? Talk with your health care provider about when you should start having regular mammograms. This may depend on whether you have a family history of breast cancer.  BRCA-related cancer screening. This may be done if you have a family history of breast, ovarian, tubal, or peritoneal cancers.  Pelvic exam and Pap test. ? This may be done every 3 years starting at age 19. ? Starting at age 2, this may be done every 5 years if you have a Pap test in combination with an HPV test. Other tests  STD (sexually transmitted disease) testing, if you are at risk.  Bone density scan. This is done to screen for osteoporosis. You may have this scan if you are at high risk for osteoporosis. Talk with your health care provider about your test results, treatment options,  and if necessary, the need for more tests. Follow these instructions at home: Eating and drinking  Eat a diet that includes fresh fruits and vegetables, whole grains, lean protein, and low-fat dairy products.  Take vitamin and mineral supplements as recommended by your health care provider.  Do not drink alcohol if: ? Your health care provider tells you not to drink. ? You are pregnant, may be pregnant, or are planning  to become pregnant.  If you drink alcohol: ? Limit how much you have to 0-1 drink a day. ? Be aware of how much alcohol is in your drink. In the [place] one drink equals one 12 oz bottle of beer (355 mL), one 5 oz glass of wine (148 mL), or one 1 oz glass of hard liquor (44 mL).   Lifestyle  Take daily care of your teeth and gums. Brush your teeth every morning and night with fluoride toothpaste. Floss one time each day.  Stay active. Exercise for at least 30 minutes 5 or more days each week.  Do not use any products that contain nicotine or tobacco, such as cigarettes, e-cigarettes, and chewing tobacco. If you need help quitting, ask your health care provider.  Do not use drugs.  If you are sexually active, practice safe sex. Use a condom or other form of protection to prevent STIs (sexually transmitted infections).  If you do not wish to become pregnant, use a form of birth control. If you plan to become pregnant, see your health care provider for a prepregnancy visit.  If told by your health care provider, take low-dose aspirin daily starting at age 40.  Find healthy ways to cope with stress, such as: ? Meditation, yoga, or listening to music. ? Journaling. ? Talking to a trusted person. ? Spending time with friends and family. Safety  Always wear your seat belt while driving or riding in a vehicle.  Do not drive: ? If you have been drinking alcohol. Do not ride with someone who has been drinking. ? When you are tired or distracted. ? While texting.  Wear a helmet and other protective equipment during sports activities.  If you have firearms in your house, make sure you follow all gun safety procedures. What's next?  Visit your health care provider once a year for an annual wellness visit.  Ask your health care provider how often you should have your eyes and teeth checked.  Stay up to date on all vaccines. This information is not intended to replace advice given to you  by your health care provider. Make sure you discuss any questions you have with your health care provider. Document Revised: 09/23/2019 Document Reviewed: 08/30/2017 Elsevier Patient Education  2021 [employer].

## 2020-05-03 ENCOUNTER — Ambulatory Visit (INDEPENDENT_AMBULATORY_CARE_PROVIDER_SITE_OTHER): Payer: 59 | Admitting: Family Medicine

## 2020-05-03 ENCOUNTER — Other Ambulatory Visit: Payer: Self-pay

## 2020-05-03 ENCOUNTER — Encounter: Payer: Self-pay | Admitting: Family Medicine

## 2020-05-03 VITALS — BP 152/83 | HR 80 | Temp 98.7°F | Resp 16 | Ht 62.0 in | Wt 234.5 lb

## 2020-05-03 DIAGNOSIS — Z0000 Encounter for general adult medical examination without abnormal findings: Secondary | ICD-10-CM | POA: Diagnosis not present

## 2020-05-03 DIAGNOSIS — Z1231 Encounter for screening mammogram for malignant neoplasm of breast: Secondary | ICD-10-CM | POA: Diagnosis not present

## 2020-05-03 DIAGNOSIS — Z1159 Encounter for screening for other viral diseases: Secondary | ICD-10-CM

## 2020-05-03 DIAGNOSIS — Z6841 Body Mass Index (BMI) 40.0 and over, adult: Secondary | ICD-10-CM

## 2020-05-03 DIAGNOSIS — N951 Menopausal and female climacteric states: Secondary | ICD-10-CM

## 2020-05-03 NOTE — Assessment & Plan Note (Signed)
Discussed natural course Could consider IUD if contiues to have frequent periods

## 2020-05-03 NOTE — Progress Notes (Signed)
Complete physical exam   Patient: Kendra Kim   DOB: [birth year]   50 y.o. Female  MRN: [id number] Visit Date: 05/03/2020  Today's healthcare provider: Lavon Paganini, MD   Chief Complaint  Patient presents with  . Annual Exam   Subjective    Kendra Kim is a 50 y.o. female who presents today for a complete physical exam.  She reports consuming a general diet. The patient does not participate in regular exercise at present. She generally feels well. She reports sleeping fairly well. She does not have additional problems to discuss today.  HPI  03/22/17 Pap/HPV-negative 04/09/19 Mammogram-BI-RADS1 06/12/19 Colonoscopy-polyp, repeat in 5 years  Perimenopause   She states that she is having irregular menstrual cycles. She is menstruating for the second time this month.  Knee Injury She is still experiencing knee pain. She states that she does plan to get a knee replacement surgery in September. She is continuing to do chair exercises which alleviate some of her knee pain.   History reviewed. No pertinent past medical history. Past Surgical History:  Procedure Laterality Date  . COLONOSCOPY WITH PROPOFOL N/A 06/12/2019   Procedure: COLONOSCOPY WITH PROPOFOL;  Surgeon: Lin Landsman, MD;  Location: [facility] ENDOSCOPY;  Service: Endoscopy;  Laterality: N/A;  . FOOT SURGERY Right    x 2, realigned 2nd toe and then fused, by podiatrist (Dr Paulla Dolly)  . TUBAL LIGATION  2005   Social History   Socioeconomic History  . Marital status: Married    Spouse name: Gerald Stabs  . Number of children: 3  . Years of education: 60  . Highest education level: Associate degree: academic program  Occupational History    Employer: [employer]  Tobacco Use  . Smoking status: Never Smoker  . Smokeless tobacco: Never Used  Vaping Use  . Vaping Use: Never used  Substance and Sexual Activity  . Alcohol use: Yes    Comment: social  . Drug use: Never  . Sexual activity: Yes    Partners:  Male    Birth control/protection: Surgical  Other Topics Concern  . Not on file  Social History Narrative  . Not on file   Social Determinants of Health   Financial Resource Strain: Not on file  Food Insecurity: Not on file  Transportation Needs: Not on file  Physical Activity: Not on file  Stress: Not on file  Social Connections: Not on file  Intimate Partner Violence: Not on file   Family Status  Relation Name Status  . Mother  Alive  . Father  Alive  . Sister  Alive  . MGM  (Not Specified)  . PGM  (Not Specified)  . PGF  (Not Specified)   Family History  Problem Relation Age of Onset  . Epilepsy Mother   . Osteoporosis Mother   . Colon cancer Mother 110  . Macular degeneration Mother   . Deep vein thrombosis Mother        after ortho injury  . Cancer Mother        Multiple Myeloma  . Hypertension Father   . Glaucoma Father   . Bipolar disorder Sister   . Lymphoma Sister   . Breast cancer Maternal Grandmother 80  . Diabetes Paternal Grandmother   . Diabetes Paternal Grandfather    No Known Allergies  Patient Care Team: Virginia Crews, MD as PCP - General (Family Medicine)   Medications: Outpatient Medications Prior to Visit  Medication Sig  . BIOTIN PO  Take by mouth.  . fluticasone (FLONASE) 50 MCG/ACT nasal spray Place into both nostrils daily.  . Multiple Vitamin (MULTIVITAMIN) capsule Take 1 capsule daily by mouth.  . Probiotic Product (PROBIOTIC DAILY PO) Take by mouth.   No facility-administered medications prior to visit.    Review of Systems  Constitutional: Negative for chills, fatigue and fever.  HENT: Negative for congestion, ear pain, rhinorrhea, sinus pain and sore throat.   Respiratory: Negative for cough, shortness of breath and wheezing.   Cardiovascular: Negative for chest pain and leg swelling.  Gastrointestinal: Negative for abdominal pain, blood in stool, diarrhea, nausea and vomiting.  Genitourinary: Negative for dysuria,  flank pain, frequency and urgency.  Neurological: Negative for dizziness and headaches.  All other systems reviewed and are negative.     Objective    BP (!) 152/83   Pulse 80   Temp 98.7 F (37.1 C) (Oral)   Resp 16   Ht $R'5\' 2"'QS$  (1.575 m)   Wt 234 lb 8 oz (106.4 kg)   SpO2 100%   BMI 42.89 kg/m    Physical Exam Vitals reviewed.  Constitutional:      General: She is not in acute distress.    Appearance: Normal appearance. She is well-developed. She is not diaphoretic.  HENT:     Head: Normocephalic and atraumatic.     Right Ear: Tympanic membrane, ear canal and external ear normal.     Left Ear: Tympanic membrane, ear canal and external ear normal.     Nose: Nose normal.     Mouth/Throat:     Mouth: Mucous membranes are moist.     Pharynx: Oropharynx is clear. No oropharyngeal exudate.  Eyes:     General: No scleral icterus.    Conjunctiva/sclera: Conjunctivae normal.     Pupils: Pupils are equal, round, and reactive to light.  Neck:     Thyroid: No thyromegaly.  Cardiovascular:     Rate and Rhythm: Normal rate and regular rhythm.     Pulses: Normal pulses.     Heart sounds: Normal heart sounds. No murmur heard.   Pulmonary:     Effort: Pulmonary effort is normal. No respiratory distress.     Breath sounds: Normal breath sounds. No wheezing or rales.  Abdominal:     General: There is no distension.     Palpations: Abdomen is soft.     Tenderness: There is no abdominal tenderness.  Musculoskeletal:        General: No deformity.     Cervical back: Neck supple.     Right lower leg: No edema.     Left lower leg: No edema.  Lymphadenopathy:     Cervical: No cervical adenopathy.  Skin:    General: Skin is warm and dry.     Findings: No rash.  Neurological:     Mental Status: She is alert and oriented to person, place, and time. Mental status is at baseline.     Sensory: No sensory deficit.     Motor: No weakness.     Gait: Gait normal.  Psychiatric:         Mood and Affect: Mood normal.        Behavior: Behavior normal.        Thought Content: Thought content normal.     Last depression screening scores PHQ 2/9 Scores 05/03/2020 10/27/2019 04/29/2019  PHQ - 2 Score 0 0 0  PHQ- 9 Score 0 0 -   Last fall risk screening  Fall Risk  10/27/2019  Falls in the past year? 0  Number falls in past yr: 0  Injury with Fall? 0  Risk for fall due to : No Fall Risks  Follow up Falls evaluation completed   Last Audit-C alcohol use screening Alcohol Use Disorder Test (AUDIT) 05/03/2020  1. How often do you have a drink containing alcohol? 1  2. How many drinks containing alcohol do you have on a typical day when you are drinking? 0  3. How often do you have six or more drinks on one occasion? 0  AUDIT-C Score 1  Alcohol Brief Interventions/Follow-up -   A score of 3 or more in women, and 4 or more in men indicates increased risk for alcohol abuse, EXCEPT if all of the points are from question 1   No results found for any visits on 05/03/20.  Assessment & Plan    Routine Health Maintenance and Physical Exam  Exercise Activities and Dietary recommendations Goals   None     Immunization History  Administered Date(s) Administered  . Moderna Sars-Covid-2 Vaccination 08/21/2019, 09/22/2019  . Tdap 11/08/2016    Health Maintenance  Topic Date Due  . Hepatitis C Screening  Never done  . COVID-19 Vaccine (3 - Booster for Moderna series) 03/21/2020  . INFLUENZA VACCINE  08/02/2020  . MAMMOGRAM  04/08/2021  . PAP SMEAR-Modifier  03/23/2022  . COLONOSCOPY (Pts 45-58yrs Insurance coverage will need to be confirmed)  06/11/2024  . TETANUS/TDAP  11/09/2026  . HIV Screening  Completed  . HPV VACCINES  Aged Out    Discussed health benefits of physical activity, and encouraged her to engage in regular exercise appropriate for her age and condition.  Problem List Items Addressed This Visit      Other   Morbid obesity ([place])    Discussed importance  of healthy weight management Discussed diet and exercise       Relevant Orders   Comprehensive metabolic panel   Lipid Panel With LDL/HDL Ratio   TSH   CBC   Hemoglobin A1c   Perimenopausal    Discussed natural course Could consider IUD if contiues to have frequent periods       Other Visit Diagnoses    Encounter for annual health examination    -  Primary   Relevant Orders   Comprehensive metabolic panel   Lipid Panel With LDL/HDL Ratio   TSH   CBC   Hemoglobin A1c   Encounter for screening mammogram for malignant neoplasm of breast       Relevant Orders   MM 3D SCREEN BREAST BILATERAL   Encounter for hepatitis C screening test for low risk patient       Relevant Orders   Hepatitis C antibody   BMI 40.0-44.9, adult ([place])          Return in about 1 year (around 05/03/2021) for CPE.     Frederic Jericho Moorehead,acting as a Education administrator for Lavon Paganini, MD.,have documented all relevant documentation on the behalf of Lavon Paganini, MD,as directed by  Lavon Paganini, MD while in the presence of Lavon Paganini, MD.  I, Lavon Paganini, MD, have reviewed all documentation for this visit. The documentation on 05/03/20 for the exam, diagnosis, procedures, and orders are all accurate and complete.   Zebediah Beezley, Dionne Bucy, MD, MPH [facility]

## 2020-05-03 NOTE — Assessment & Plan Note (Signed)
Discussed importance of healthy weight management Discussed diet and exercise  

## 2020-05-04 LAB — LIPID PANEL WITH LDL/HDL RATIO
Cholesterol, Total: 176 mg/dL (ref 100–199)
HDL: 57 mg/dL (ref >39)
LDL Chol Calc (NIH): 97 mg/dL (ref 0–99)
LDL/HDL Ratio: 1.7 ratio (ref 0.0–3.2)
Triglycerides: 125 mg/dL (ref 0–149)
VLDL Cholesterol Cal: 22 mg/dL (ref 5–40)

## 2020-05-04 LAB — CBC
Hematocrit: 45.7 % (ref 34.0–46.6)
Hemoglobin: 15.3 g/dL (ref 11.1–15.9)
MCH: 29.9 pg (ref 26.6–33.0)
MCHC: 33.5 g/dL (ref 31.5–35.7)
MCV: 89 fL (ref 79–97)
Platelets: 366 10*3/uL (ref 150–450)
RBC: 5.12 x10E6/uL (ref 3.77–5.28)
RDW: 12.9 % (ref 11.7–15.4)
WBC: 10.2 10*3/uL (ref 3.4–10.8)

## 2020-05-04 LAB — HEMOGLOBIN A1C
Est. average glucose Bld gHb Est-mCnc: 120 mg/dL
Hgb A1c MFr Bld: 5.8 % — ABNORMAL HIGH (ref 4.8–5.6)

## 2020-05-04 LAB — COMPREHENSIVE METABOLIC PANEL
ALT: 37 IU/L — ABNORMAL HIGH (ref 0–32)
AST: 15 IU/L (ref 0–40)
Albumin/Globulin Ratio: 1.6 (ref 1.2–2.2)
Albumin: 4.5 g/dL (ref 3.8–4.8)
Alkaline Phosphatase: 129 IU/L — ABNORMAL HIGH (ref 44–121)
BUN/Creatinine Ratio: 28 — ABNORMAL HIGH (ref 9–23)
BUN: 23 mg/dL (ref 6–24)
Bilirubin Total: 0.3 mg/dL (ref 0.0–1.2)
CO2: 20 mmol/L (ref 20–29)
Calcium: 9.8 mg/dL (ref 8.7–10.2)
Chloride: 104 mmol/L (ref 96–106)
Creatinine, Ser: 0.83 mg/dL (ref 0.57–1.00)
Globulin, Total: 2.9 g/dL (ref 1.5–4.5)
Glucose: 92 mg/dL (ref 65–99)
Potassium: 4.9 mmol/L (ref 3.5–5.2)
Sodium: 141 mmol/L (ref 134–144)
Total Protein: 7.4 g/dL (ref 6.0–8.5)
eGFR: 86 mL/min/{1.73_m2} (ref >59)

## 2020-05-04 LAB — HEPATITIS C ANTIBODY: Hep C Virus Ab: <0.1 s/co ratio (ref 0.0–0.9)

## 2020-05-04 LAB — TSH: TSH: 1.44 u[IU]/mL (ref 0.450–4.500)

## 2020-05-05 ENCOUNTER — Telehealth: Payer: Self-pay | Admitting: *Deleted

## 2020-05-05 NOTE — Telephone Encounter (Signed)
Patient returning call for lab results. Reviewed physician's note with the patient. No questions asked.

## 2020-05-17 ENCOUNTER — Encounter: Payer: Self-pay | Admitting: Family Medicine

## 2020-06-18 ENCOUNTER — Encounter: Payer: Self-pay | Admitting: Family Medicine

## 2020-08-03 ENCOUNTER — Other Ambulatory Visit: Payer: Self-pay

## 2020-08-03 ENCOUNTER — Ambulatory Visit
Admission: RE | Admit: 2020-08-03 | Discharge: 2020-08-03 | Disposition: A | Discharge status: Home / Self Care | Payer: 59 | Source: Ambulatory Visit | Attending: Family Medicine | Admitting: Family Medicine

## 2020-08-03 DIAGNOSIS — Z1231 Encounter for screening mammogram for malignant neoplasm of breast: Secondary | ICD-10-CM | POA: Insufficient documentation

## 2020-09-08 ENCOUNTER — Other Ambulatory Visit: Payer: Self-pay | Admitting: Orthopedic Surgery

## 2020-09-13 ENCOUNTER — Other Ambulatory Visit: Payer: Self-pay

## 2020-09-13 ENCOUNTER — Encounter
Admission: RE | Admit: 2020-09-13 | Discharge: 2020-09-13 | Disposition: A | Discharge status: Home / Self Care | Payer: 59 | Source: Ambulatory Visit | Attending: Orthopedic Surgery | Admitting: Orthopedic Surgery

## 2020-09-13 DIAGNOSIS — Z01818 Encounter for other preprocedural examination: Secondary | ICD-10-CM | POA: Diagnosis present

## 2020-09-13 LAB — URINALYSIS, ROUTINE W REFLEX MICROSCOPIC
Bilirubin Urine: NEGATIVE
Glucose, UA: NEGATIVE mg/dL
Hgb urine dipstick: NEGATIVE
Ketones, ur: NEGATIVE mg/dL
Leukocytes,Ua: NEGATIVE
Nitrite: NEGATIVE
Protein, ur: NEGATIVE mg/dL
Specific Gravity, Urine: 1.023 (ref 1.005–1.030)
pH: 6 (ref 5.0–8.0)

## 2020-09-13 LAB — COMPREHENSIVE METABOLIC PANEL
ALT: 30 U/L (ref 0–44)
AST: 22 U/L (ref 15–41)
Albumin: 4.1 g/dL (ref 3.5–5.0)
Alkaline Phosphatase: 125 U/L (ref 38–126)
Anion gap: 8 (ref 5–15)
BUN: 17 mg/dL (ref 6–20)
CO2: 25 mmol/L (ref 22–32)
Calcium: 8.9 mg/dL (ref 8.9–10.3)
Chloride: 105 mmol/L (ref 98–111)
Creatinine, Ser: 0.69 mg/dL (ref 0.44–1.00)
GFR, Estimated: >60 mL/min (ref >60)
Glucose, Bld: 97 mg/dL (ref 70–99)
Potassium: 3.7 mmol/L (ref 3.5–5.1)
Sodium: 138 mmol/L (ref 135–145)
Total Bilirubin: 0.3 mg/dL (ref 0.3–1.2)
Total Protein: 7 g/dL (ref 6.5–8.1)

## 2020-09-13 LAB — CBC WITH DIFFERENTIAL/PLATELET
Abs Immature Granulocytes: 0.02 10*3/uL (ref 0.00–0.07)
Basophils Absolute: 0.1 10*3/uL (ref 0.0–0.1)
Basophils Relative: 1 %
Eosinophils Absolute: 0.2 10*3/uL (ref 0.0–0.5)
Eosinophils Relative: 3 %
HCT: 40.1 % (ref 36.0–46.0)
Hemoglobin: 13.5 g/dL (ref 12.0–15.0)
Immature Granulocytes: 0 %
Lymphocytes Relative: 39 %
Lymphs Abs: 3 10*3/uL (ref 0.7–4.0)
MCH: 30.1 pg (ref 26.0–34.0)
MCHC: 33.7 g/dL (ref 30.0–36.0)
MCV: 89.3 fL (ref 80.0–100.0)
Monocytes Absolute: 0.7 10*3/uL (ref 0.1–1.0)
Monocytes Relative: 9 %
Neutro Abs: 3.9 10*3/uL (ref 1.7–7.7)
Neutrophils Relative %: 48 %
Platelets: 351 10*3/uL (ref 150–400)
RBC: 4.49 MIL/uL (ref 3.87–5.11)
RDW: 13.3 % (ref 11.5–15.5)
WBC: 7.9 10*3/uL (ref 4.0–10.5)
nRBC: 0 % (ref 0.0–0.2)

## 2020-09-13 LAB — SURGICAL PCR SCREEN
MRSA, PCR: NEGATIVE
Staphylococcus aureus: POSITIVE — AB

## 2020-09-13 NOTE — Patient Instructions (Signed)
Your procedure is scheduled on: Thursday September 23, 2020. Report to Day Surgery inside [place] 2nd floor stop by admissions desk before getting on elevator. To find out your arrival time please call [phone] between 1PM - 3PM on Tuesday September 21, 2020.  Remember: Instructions that are not followed completely may result in serious medical risk,  up to and including death, or upon the discretion of your surgeon and anesthesiologist your  surgery may need to be rescheduled.     _X__ 1. Do not eat food after midnight the night before your procedure.                 No chewing gum or hard candies. You may drink clear liquids up to 2 hours                 before you are scheduled to arrive for your surgery- DO not drink clear                 liquids within 2 hours of the start of your surgery.                 Clear Liquids include:  water, apple juice without pulp, clear Gatorade, G2 or                  Gatorade Zero (avoid Red/Purple/Blue), Black Coffee or Tea (Do not add                 anything to coffee or tea).  __X__2.   Complete the "Ensure Clear Pre-surgery Clear Carbohydrate Drink" provided to you, 2 hours before arrival. **If you are diabetic you will be provided with an alternative drink, Gatorade Zero or G2.  __X__3.  On the morning of surgery brush your teeth with toothpaste and water, you                may rinse your mouth with mouthwash if you wish.  Do not swallow any toothpaste of mouthwash.     _X__ 4.  No Alcohol for 24 hours before or after surgery.   _X__ 5.  Do Not Smoke or use e-cigarettes For 24 Hours Prior to Your Surgery.                 Do not use any chewable tobacco products for at least 6 hours prior to                 Surgery.  _X__  6.  Do not use any recreational drugs (marijuana, cocaine, heroin, ecstasy, MDMA or other)                For at least one week prior to your surgery.  Combination of these drugs with  anesthesia                May have life threatening results.  __X__  7.  Notify your doctor if there is any change in your medical condition      (cold, fever, infections).     Do not wear jewelry, make-up, hairpins, clips or nail polish. Do not wear lotions, powders, or perfumes. You may wear deodorant. Do not shave 48 hours prior to surgery. Men may shave face and neck. Do not bring valuables to the hospital.    [facility] is not responsible for any belongings or valuables.  Contacts, dentures or bridgework may not be worn into surgery. Leave your suitcase in the car. After surgery  it may be brought to your room. For patients admitted to the hospital, discharge time is determined by your treatment team.   Patients discharged the day of surgery will not be allowed to drive home.   Make arrangements for someone to be with you for the first 24 hours of your Same Day Discharge.    Please read over the following fact sheets that you were given:   Total Joint Packet    __X__ Take these medicines the morning of surgery with A SIP OF WATER:    1. None   2.   3.   4.  5.  6.  ____ Fleet Enema (as directed)   __X__ Use CHG Soap (or wipes) as directed  ____ Use Benzoyl Peroxide Gel as instructed  ____ Use inhalers on the day of surgery  ____ Stop metformin 2 days prior to surgery    ____ Take 1/2 of usual insulin dose the night before surgery. No insulin the morning          of surgery.   ____ Call your PCP, cardiologist, or Pulmonologist if taking Coumadin/Plavix/aspirin and ask when to stop before your surgery.   __X__ One Week prior to surgery- Stop Anti-inflammatories such as Ibuprofen, Aleve, Advil, Motrin, meloxicam (MOBIC), diclofenac, etodolac, ketorolac, Toradol, Daypro, piroxicam, Goody's or BC powders. OK TO USE TYLENOL IF NEEDED   __X__ Stop ALL supplements until after surgery.    ____ Bring C-Pap to the hospital.    If you have any questions regarding  your pre-procedure instructions,  Please call Pre-admit Testing at [phone].

## 2020-09-14 LAB — TYPE AND SCREEN
ABO/RH(D): A POS
Antibody Screen: NEGATIVE

## 2020-09-21 ENCOUNTER — Other Ambulatory Visit
Admission: RE | Admit: 2020-09-21 | Discharge: 2020-09-21 | Disposition: A | Discharge status: Home / Self Care | Payer: 59 | Source: Ambulatory Visit | Attending: Orthopedic Surgery | Admitting: Orthopedic Surgery

## 2020-09-21 ENCOUNTER — Other Ambulatory Visit: Payer: Self-pay

## 2020-09-21 DIAGNOSIS — Z01812 Encounter for preprocedural laboratory examination: Secondary | ICD-10-CM | POA: Diagnosis present

## 2020-09-21 DIAGNOSIS — Z20822 Contact with and (suspected) exposure to covid-19: Secondary | ICD-10-CM | POA: Diagnosis not present

## 2020-09-21 LAB — SARS CORONAVIRUS 2 (TAT 6-24 HRS): SARS Coronavirus 2: NEGATIVE

## 2020-09-23 ENCOUNTER — Ambulatory Visit: Payer: 59 | Admitting: Certified Registered"

## 2020-09-23 ENCOUNTER — Observation Stay
Admission: RE | Admit: 2020-09-23 | Discharge: 2020-09-25 | Disposition: A | Discharge status: Home / Self Care | Payer: 59 | Attending: Orthopedic Surgery | Admitting: Orthopedic Surgery

## 2020-09-23 ENCOUNTER — Encounter: Payer: Self-pay | Admitting: Orthopedic Surgery

## 2020-09-23 ENCOUNTER — Observation Stay: Payer: 59

## 2020-09-23 ENCOUNTER — Other Ambulatory Visit: Payer: Self-pay

## 2020-09-23 ENCOUNTER — Encounter
Admission: RE | Disposition: A | Discharge status: Home / Self Care | Payer: Self-pay | Source: Home / Self Care | Attending: Orthopedic Surgery

## 2020-09-23 DIAGNOSIS — Z96651 Presence of right artificial knee joint: Secondary | ICD-10-CM

## 2020-09-23 DIAGNOSIS — G8918 Other acute postprocedural pain: Secondary | ICD-10-CM

## 2020-09-23 DIAGNOSIS — M1711 Unilateral primary osteoarthritis, right knee: Principal | ICD-10-CM | POA: Insufficient documentation

## 2020-09-23 HISTORY — PX: TOTAL KNEE ARTHROPLASTY: SHX125

## 2020-09-23 LAB — CBC
HCT: 41.4 % (ref 36.0–46.0)
Hemoglobin: 13.5 g/dL (ref 12.0–15.0)
MCH: 29.7 pg (ref 26.0–34.0)
MCHC: 32.6 g/dL (ref 30.0–36.0)
MCV: 91.2 fL (ref 80.0–100.0)
Platelets: 320 10*3/uL (ref 150–400)
RBC: 4.54 MIL/uL (ref 3.87–5.11)
RDW: 13.7 % (ref 11.5–15.5)
WBC: 6.2 10*3/uL (ref 4.0–10.5)
nRBC: 0 % (ref 0.0–0.2)

## 2020-09-23 LAB — ABO/RH: ABO/RH(D): A POS

## 2020-09-23 LAB — CREATININE, SERUM
Creatinine, Ser: 0.68 mg/dL (ref 0.44–1.00)
GFR, Estimated: >60 mL/min (ref >60)

## 2020-09-23 LAB — POCT PREGNANCY, URINE: Preg Test, Ur: NEGATIVE

## 2020-09-23 SURGERY — ARTHROPLASTY, KNEE, TOTAL
Anesthesia: Spinal | Site: Knee | Laterality: Right

## 2020-09-23 MED ORDER — DIPHENHYDRAMINE HCL 12.5 MG/5ML PO ELIX
12.5000 mg | ORAL_SOLUTION | ORAL | Status: DC | PRN
Start: 2020-09-23 — End: 2020-09-25

## 2020-09-23 MED ORDER — PHENOL 1.4 % MT LIQD
1.0000 | OROMUCOSAL | Status: DC | PRN
  Filled 2020-09-23: qty 177

## 2020-09-23 MED ORDER — FENTANYL CITRATE (PF) 100 MCG/2ML IJ SOLN
25.0000 ug | INTRAMUSCULAR | Status: DC | PRN
  Administered 2020-09-23 (×2): 25 ug via INTRAVENOUS

## 2020-09-23 MED ORDER — CHLORHEXIDINE GLUCONATE 0.12 % MT SOLN: 15.0000 mL | Freq: Once | OROMUCOSAL | Status: AC

## 2020-09-23 MED ORDER — RISAQUAD PO CAPS
1.0000 | ORAL_CAPSULE | Freq: Every day | ORAL | Status: DC
  Administered 2020-09-24 – 2020-09-25 (×2): 1 via ORAL
  Filled 2020-09-23 (×2): qty 1

## 2020-09-23 MED ORDER — DOCUSATE SODIUM 100 MG PO CAPS
100.0000 mg | ORAL_CAPSULE | Freq: Two times a day (BID) | ORAL | Status: DC
  Administered 2020-09-23 – 2020-09-25 (×4): 100 mg via ORAL
  Filled 2020-09-23 (×4): qty 1

## 2020-09-23 MED ORDER — FAMOTIDINE 20 MG PO TABS: 20.0000 mg | ORAL_TABLET | Freq: Once | ORAL | Status: AC

## 2020-09-23 MED ORDER — FLEET ENEMA 7-19 GM/118ML RE ENEM: 1.0000 | ENEMA | Freq: Once | RECTAL | Status: DC | PRN

## 2020-09-23 MED ORDER — MIDAZOLAM HCL 2 MG/2ML IJ SOLN
INTRAMUSCULAR | Status: AC
  Filled 2020-09-23: qty 2

## 2020-09-23 MED ORDER — CEFAZOLIN SODIUM-DEXTROSE 2-4 GM/100ML-% IV SOLN
2.0000 g | INTRAVENOUS | Status: AC
  Administered 2020-09-23: 2 g via INTRAVENOUS

## 2020-09-23 MED ORDER — HYDROMORPHONE HCL 1 MG/ML IJ SOLN
0.2500 mg | INTRAMUSCULAR | Status: DC | PRN
  Administered 2020-09-23: 0.25 mg via INTRAVENOUS
  Administered 2020-09-23: 0.5 mg via INTRAVENOUS
  Administered 2020-09-23 (×2): 0.25 mg via INTRAVENOUS

## 2020-09-23 MED ORDER — ONDANSETRON HCL 4 MG PO TABS: 4.0000 mg | ORAL_TABLET | Freq: Four times a day (QID) | ORAL | Status: DC | PRN

## 2020-09-23 MED ORDER — MAGNESIUM HYDROXIDE 400 MG/5ML PO SUSP
30.0000 mL | Freq: Every day | ORAL | Status: DC
  Administered 2020-09-23 – 2020-09-25 (×3): 30 mL via ORAL
  Filled 2020-09-23 (×3): qty 30

## 2020-09-23 MED ORDER — HYDROMORPHONE HCL 1 MG/ML IJ SOLN
INTRAMUSCULAR | Status: AC
  Administered 2020-09-23: 0.5 mg via INTRAVENOUS
  Filled 2020-09-23: qty 1

## 2020-09-23 MED ORDER — CALCIUM CARBONATE ANTACID 500 MG PO CHEW: 1.0000 | CHEWABLE_TABLET | Freq: Every day | ORAL | Status: DC | PRN

## 2020-09-23 MED ORDER — MIDAZOLAM HCL 5 MG/5ML IJ SOLN
INTRAMUSCULAR | Status: DC | PRN
  Administered 2020-09-23: 2 mg via INTRAVENOUS

## 2020-09-23 MED ORDER — ENOXAPARIN SODIUM 30 MG/0.3ML IJ SOSY
30.0000 mg | PREFILLED_SYRINGE | Freq: Two times a day (BID) | INTRAMUSCULAR | Status: DC
  Administered 2020-09-24 – 2020-09-25 (×3): 30 mg via SUBCUTANEOUS
  Filled 2020-09-23 (×3): qty 0.3

## 2020-09-23 MED ORDER — HYDROCODONE-ACETAMINOPHEN 7.5-325 MG PO TABS
1.0000 | ORAL_TABLET | ORAL | Status: DC | PRN
  Administered 2020-09-24: 1 via ORAL
  Filled 2020-09-23: qty 1

## 2020-09-23 MED ORDER — BISACODYL 10 MG RE SUPP: 10.0000 mg | Freq: Every day | RECTAL | Status: DC | PRN

## 2020-09-23 MED ORDER — ZOLPIDEM TARTRATE 5 MG PO TABS
5.0000 mg | ORAL_TABLET | Freq: Every evening | ORAL | Status: DC | PRN
  Administered 2020-09-23 – 2020-09-24 (×2): 5 mg via ORAL
  Filled 2020-09-23 (×2): qty 1

## 2020-09-23 MED ORDER — MENTHOL 3 MG MT LOZG
1.0000 | LOZENGE | OROMUCOSAL | Status: DC | PRN
  Filled 2020-09-23: qty 9

## 2020-09-23 MED ORDER — BUPIVACAINE-EPINEPHRINE (PF) 0.25% -1:200000 IJ SOLN
INTRAMUSCULAR | Status: AC
  Filled 2020-09-23: qty 30

## 2020-09-23 MED ORDER — CEFAZOLIN SODIUM-DEXTROSE 2-4 GM/100ML-% IV SOLN
2.0000 g | Freq: Four times a day (QID) | INTRAVENOUS | Status: AC
  Filled 2020-09-23: qty 100

## 2020-09-23 MED ORDER — BUPIVACAINE LIPOSOME 1.3 % IJ SUSP
INTRAMUSCULAR | Status: AC
  Filled 2020-09-23: qty 20

## 2020-09-23 MED ORDER — CEFAZOLIN SODIUM-DEXTROSE 2-4 GM/100ML-% IV SOLN
INTRAVENOUS | Status: AC
  Administered 2020-09-23: 2 g via INTRAVENOUS
  Filled 2020-09-23: qty 100

## 2020-09-23 MED ORDER — FENTANYL CITRATE (PF) 100 MCG/2ML IJ SOLN
INTRAMUSCULAR | Status: AC
  Administered 2020-09-23: 50 ug via INTRAVENOUS
  Filled 2020-09-23: qty 2

## 2020-09-23 MED ORDER — METHOCARBAMOL 500 MG PO TABS
ORAL_TABLET | ORAL | Status: AC
  Filled 2020-09-23: qty 1

## 2020-09-23 MED ORDER — METHOCARBAMOL 500 MG PO TABS
500.0000 mg | ORAL_TABLET | Freq: Four times a day (QID) | ORAL | Status: DC | PRN
  Administered 2020-09-23 – 2020-09-24 (×2): 500 mg via ORAL
  Filled 2020-09-23: qty 1

## 2020-09-23 MED ORDER — FENTANYL CITRATE (PF) 100 MCG/2ML IJ SOLN
INTRAMUSCULAR | Status: AC
  Filled 2020-09-23: qty 2

## 2020-09-23 MED ORDER — SODIUM CHLORIDE FLUSH 0.9 % IV SOLN
INTRAVENOUS | Status: AC
  Filled 2020-09-23: qty 40

## 2020-09-23 MED ORDER — HYDROCODONE-ACETAMINOPHEN 7.5-325 MG PO TABS
ORAL_TABLET | ORAL | Status: AC
  Administered 2020-09-23: 1 via ORAL
  Filled 2020-09-23: qty 1

## 2020-09-23 MED ORDER — SODIUM CHLORIDE (PF) 0.9 % IJ SOLN
INTRAMUSCULAR | Status: DC | PRN
  Administered 2020-09-23: 91 mL

## 2020-09-23 MED ORDER — PROBIOTIC DAILY PO CAPS: ORAL_CAPSULE | Freq: Every day | ORAL | Status: DC

## 2020-09-23 MED ORDER — LACTATED RINGERS IV SOLN: INTRAVENOUS | Status: DC

## 2020-09-23 MED ORDER — FENTANYL CITRATE (PF) 100 MCG/2ML IJ SOLN
INTRAMUSCULAR | Status: DC | PRN
  Administered 2020-09-23: 25 ug via INTRAVENOUS

## 2020-09-23 MED ORDER — TRAMADOL HCL 50 MG PO TABS
50.0000 mg | ORAL_TABLET | Freq: Four times a day (QID) | ORAL | Status: DC
  Administered 2020-09-24 – 2020-09-25 (×7): 50 mg via ORAL
  Filled 2020-09-23 (×7): qty 1

## 2020-09-23 MED ORDER — POLYETHYLENE GLYCOL 3350 17 G PO PACK: 17.0000 g | PACK | Freq: Every day | ORAL | Status: DC | PRN

## 2020-09-23 MED ORDER — HYDROMORPHONE HCL 1 MG/ML IJ SOLN
INTRAMUSCULAR | Status: AC
  Administered 2020-09-23: 0.25 mg via INTRAVENOUS
  Filled 2020-09-23: qty 1

## 2020-09-23 MED ORDER — DEXMEDETOMIDINE (PRECEDEX) IN NS 20 MCG/5ML (4 MCG/ML) IV SYRINGE
PREFILLED_SYRINGE | INTRAVENOUS | Status: AC
  Filled 2020-09-23: qty 5

## 2020-09-23 MED ORDER — MORPHINE SULFATE (PF) 10 MG/ML IV SOLN
INTRAVENOUS | Status: AC
  Filled 2020-09-23: qty 1

## 2020-09-23 MED ORDER — DEXMEDETOMIDINE (PRECEDEX) IN NS 20 MCG/5ML (4 MCG/ML) IV SYRINGE
PREFILLED_SYRINGE | INTRAVENOUS | Status: DC | PRN
  Administered 2020-09-23: 12 ug via INTRAVENOUS

## 2020-09-23 MED ORDER — SODIUM CHLORIDE 0.9 % IR SOLN
Status: DC | PRN
  Administered 2020-09-23: 3012 mL

## 2020-09-23 MED ORDER — CHLORHEXIDINE GLUCONATE 0.12 % MT SOLN
OROMUCOSAL | Status: AC
  Administered 2020-09-23: 15 mL via OROMUCOSAL
  Filled 2020-09-23: qty 15

## 2020-09-23 MED ORDER — MORPHINE SULFATE (PF) 2 MG/ML IV SOLN
0.5000 mg | INTRAVENOUS | Status: DC | PRN
  Administered 2020-09-23: 1 mg via INTRAVENOUS
  Filled 2020-09-23: qty 1

## 2020-09-23 MED ORDER — ONDANSETRON HCL 4 MG/2ML IJ SOLN: 4.0000 mg | Freq: Once | INTRAMUSCULAR | Status: AC | PRN

## 2020-09-23 MED ORDER — ACETAMINOPHEN 325 MG PO TABS
325.0000 mg | ORAL_TABLET | Freq: Four times a day (QID) | ORAL | Status: DC | PRN
Start: 2020-09-24 — End: 2020-09-25

## 2020-09-23 MED ORDER — ONDANSETRON HCL 4 MG/2ML IJ SOLN
INTRAMUSCULAR | Status: AC
  Administered 2020-09-23: 4 mg via INTRAVENOUS
  Filled 2020-09-23: qty 2

## 2020-09-23 MED ORDER — SODIUM CHLORIDE 0.9 % IR SOLN
Status: DC | PRN
  Administered 2020-09-23: 1004 mL

## 2020-09-23 MED ORDER — ONDANSETRON HCL 4 MG/2ML IJ SOLN
4.0000 mg | Freq: Four times a day (QID) | INTRAMUSCULAR | Status: DC | PRN
  Administered 2020-09-23 – 2020-09-24 (×2): 4 mg via INTRAVENOUS
  Filled 2020-09-23 (×2): qty 2

## 2020-09-23 MED ORDER — CEFAZOLIN SODIUM-DEXTROSE 2-4 GM/100ML-% IV SOLN
INTRAVENOUS | Status: AC
  Administered 2020-09-23: 2000 mg
  Filled 2020-09-23: qty 100

## 2020-09-23 MED ORDER — PROPOFOL 10 MG/ML IV BOLUS
INTRAVENOUS | Status: AC
  Filled 2020-09-23: qty 40

## 2020-09-23 MED ORDER — METHOCARBAMOL 1000 MG/10ML IJ SOLN
500.0000 mg | Freq: Four times a day (QID) | INTRAVENOUS | Status: DC | PRN
  Filled 2020-09-23: qty 5

## 2020-09-23 MED ORDER — SODIUM CHLORIDE 0.9 % IV SOLN: INTRAVENOUS | Status: DC

## 2020-09-23 MED ORDER — BUPIVACAINE HCL (PF) 0.5 % IJ SOLN
INTRAMUSCULAR | Status: DC | PRN
  Administered 2020-09-23: 2.8 mL

## 2020-09-23 MED ORDER — PROPOFOL 500 MG/50ML IV EMUL
INTRAVENOUS | Status: DC | PRN
  Administered 2020-09-23: 100 ug/kg/min via INTRAVENOUS

## 2020-09-23 MED ORDER — PANTOPRAZOLE SODIUM 40 MG PO TBEC
40.0000 mg | DELAYED_RELEASE_TABLET | Freq: Every day | ORAL | Status: DC
  Administered 2020-09-23 – 2020-09-25 (×3): 40 mg via ORAL
  Filled 2020-09-23 (×3): qty 1

## 2020-09-23 MED ORDER — NEOMYCIN-POLYMYXIN B GU 40-200000 IR SOLN
Status: AC
  Filled 2020-09-23: qty 20

## 2020-09-23 MED ORDER — PROPOFOL 10 MG/ML IV BOLUS
INTRAVENOUS | Status: AC
  Filled 2020-09-23: qty 20

## 2020-09-23 MED ORDER — SURGIPHOR WOUND IRRIGATION SYSTEM - OPTIME
TOPICAL | Status: DC | PRN
  Administered 2020-09-23: 1 via TOPICAL

## 2020-09-23 MED ORDER — PROPOFOL 10 MG/ML IV BOLUS
INTRAVENOUS | Status: AC
  Filled 2020-09-23: qty 60

## 2020-09-23 MED ORDER — HYDROCODONE-ACETAMINOPHEN 5-325 MG PO TABS: 1.0000 | ORAL_TABLET | ORAL | Status: DC | PRN

## 2020-09-23 MED ORDER — FAMOTIDINE 20 MG PO TABS
ORAL_TABLET | ORAL | Status: AC
  Administered 2020-09-23: 20 mg via ORAL
  Filled 2020-09-23: qty 1

## 2020-09-23 MED ORDER — ORAL CARE MOUTH RINSE: 15.0000 mL | Freq: Once | OROMUCOSAL | Status: AC

## 2020-09-23 SURGICAL SUPPLY — 68 items
BLADE SAGITTAL 25.0X1.19X90 (BLADE) ×2 IMPLANT
BLADE SAW 90X13X1.19 OSCILLAT (BLADE) ×2 IMPLANT
BNDG ELASTIC 6X5.8 VLCR STR LF (GAUZE/BANDAGES/DRESSINGS) ×2 IMPLANT
CANISTER WOUND CARE 500ML ATS (WOUND CARE) ×2 IMPLANT
CEMENT HV SMART SET (Cement) ×4 IMPLANT
CHLORAPREP W/TINT 26 (MISCELLANEOUS) ×4 IMPLANT
COOLER POLAR GLACIER W/PUMP (MISCELLANEOUS) ×2 IMPLANT
CUFF TOURN SGL QUICK 24 (TOURNIQUET CUFF)
CUFF TOURN SGL QUICK 34 (TOURNIQUET CUFF)
CUFF TRNQT CYL 24X4X16.5-23 (TOURNIQUET CUFF) IMPLANT
CUFF TRNQT CYL 34X4.125X (TOURNIQUET CUFF) IMPLANT
DRAPE 3/4 80X56 (DRAPES) ×4 IMPLANT
DRSG MEPILEX SACRM 8.7X9.8 (GAUZE/BANDAGES/DRESSINGS) ×2 IMPLANT
ELECT CAUTERY BLADE 6.4 (BLADE) ×2 IMPLANT
ELECT REM PT RETURN 9FT ADLT (ELECTROSURGICAL) ×2
ELECTRODE REM PT RTRN 9FT ADLT (ELECTROSURGICAL) ×1 IMPLANT
FEM COMP SZ2 RIGHT MEDACTA (Joint) ×2 IMPLANT
GAUZE 4X4 16PLY ~~LOC~~+RFID DBL (SPONGE) ×2 IMPLANT
GAUZE SPONGE 4X4 12PLY STRL (GAUZE/BANDAGES/DRESSINGS) IMPLANT
GAUZE XEROFORM 1X8 LF (GAUZE/BANDAGES/DRESSINGS) IMPLANT
GLOVE SURG ORTHO LTX SZ8 (GLOVE) ×2 IMPLANT
GLOVE SURG SYN 9.0  PF PI (GLOVE) ×1
GLOVE SURG SYN 9.0 PF PI (GLOVE) ×1 IMPLANT
GLOVE SURG UNDER LTX SZ8 (GLOVE) ×2 IMPLANT
GLOVE SURG UNDER POLY LF SZ9 (GLOVE) ×2 IMPLANT
GOWN SRG 2XL LVL 4 RGLN SLV (GOWNS) ×1 IMPLANT
GOWN STRL NON-REIN 2XL LVL4 (GOWNS) ×1
GOWN STRL REUS W/ TWL LRG LVL3 (GOWN DISPOSABLE) ×1 IMPLANT
GOWN STRL REUS W/ TWL XL LVL3 (GOWN DISPOSABLE) ×1 IMPLANT
GOWN STRL REUS W/TWL LRG LVL3 (GOWN DISPOSABLE) ×1
GOWN STRL REUS W/TWL XL LVL3 (GOWN DISPOSABLE) ×1
HOLDER FOLEY CATH W/STRAP (MISCELLANEOUS) ×2 IMPLANT
IRRIGATION SURGIPHOR STRL (IV SOLUTION) ×2 IMPLANT
IV NS IRRIG 3000ML ARTHROMATIC (IV SOLUTION) ×2 IMPLANT
KIT PREVENA INCISION MGT20CM45 (CANNISTER) ×2 IMPLANT
KIT TURNOVER KIT A (KITS) ×2 IMPLANT
MANIFOLD NEPTUNE II (INSTRUMENTS) ×2 IMPLANT
NDL SAFETY ECLIPSE 18X1.5 (NEEDLE) ×1 IMPLANT
NEEDLE HYPO 18GX1.5 SHARP (NEEDLE) ×1
NEEDLE SPNL 18GX3.5 QUINCKE PK (NEEDLE) ×2 IMPLANT
NEEDLE SPNL 20GX3.5 QUINCKE YW (NEEDLE) ×2 IMPLANT
NS IRRIG 1000ML POUR BTL (IV SOLUTION) ×2 IMPLANT
PACK TOTAL KNEE (MISCELLANEOUS) ×2 IMPLANT
PAD WRAPON POLAR KNEE (MISCELLANEOUS) ×1 IMPLANT
PATELLA RESURFACING MEDACTA 02 (Bone Implant) ×2 IMPLANT
PENCIL SMOKE EVACUATOR COATED (MISCELLANEOUS) IMPLANT
PULSAVAC PLUS IRRIG FAN TIP (DISPOSABLE) ×2
SCALPEL PROTECTED #10 DISP (BLADE) ×4 IMPLANT
SPONGE T-LAP 18X18 ~~LOC~~+RFID (SPONGE) ×6 IMPLANT
STAPLER SKIN PROX 35W (STAPLE) ×2 IMPLANT
STEM EXTENSION 11MMX30MM (Stem) ×2 IMPLANT
SUCTION FRAZIER HANDLE 10FR (MISCELLANEOUS) ×1
SUCTION TUBE FRAZIER 10FR DISP (MISCELLANEOUS) ×1 IMPLANT
SUT DVC 2 QUILL PDO  T11 36X36 (SUTURE) ×1
SUT DVC 2 QUILL PDO T11 36X36 (SUTURE) ×1 IMPLANT
SUT ETHIBOND 2 V 37 (SUTURE) ×2 IMPLANT
SUT V-LOC 90 ABS DVC 3-0 CL (SUTURE) ×2 IMPLANT
SYR 20ML LL LF (SYRINGE) ×2 IMPLANT
SYR 50ML LL SCALE MARK (SYRINGE) ×4 IMPLANT
TIB INSERT FIXED SZ2 10MM 0207 (Insert) ×2 IMPLANT
TIB TRAY SZ 2 R FIXED (Joint) ×2 IMPLANT
TIP FAN IRRIG PULSAVAC PLUS (DISPOSABLE) ×1 IMPLANT
TOWEL OR 17X26 4PK STRL BLUE (TOWEL DISPOSABLE) IMPLANT
TOWER CARTRIDGE SMART MIX (DISPOSABLE) ×2 IMPLANT
TRAY FOLEY MTR SLVR 16FR STAT (SET/KITS/TRAYS/PACK) ×2 IMPLANT
WATER STERILE IRR 1000ML POUR (IV SOLUTION) ×2 IMPLANT
WATER STERILE IRR 500ML POUR (IV SOLUTION) IMPLANT
WRAPON POLAR PAD KNEE (MISCELLANEOUS) ×2

## 2020-09-23 NOTE — H&P (Addendum)
Chief Complaint  Patient presents with   Right Knee - Pain    History of the Present Illness: Kendra Kim is a 50 y.o. female here today.   The patient presents for evaluation of right knee pain. X-ray obtained today.  The patient states her right knee pain is intermittent. She states some days are worse than others. She states some days it is like a bad toothache, and some days it is as strong as the left knee. The patient states she can ambulate and get through a grocery store, but it depends on the terrain. She states since physical therapy, her right knee has gotten a lot stronger. She started walking in the morning and can do about 3 laps around the football field. The patient has had multiple right knee injections, MRI, and physical therapy in the past. She denies have surgery on her right knee. She notes she has a twist to her walk.  The patient states she has swelling to her right ankle. She has had sclerotherapy in the past.  The patient is a diabetic. Her last A1c was 6.1. She has never had blood clots in the past.  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  I have reviewed past medical, surgical, social and family history, and allergies as documented in the EMR.  Past Medical History: Past Medical History:  Diagnosis Date   Perimenopause   Past Surgical History: Past Surgical History:  Procedure Laterality Date   COLONOSCOPY 06/12/2019   FUSION FOOT MIDTARSAL/TARSOMETATARSAL  Dr. Paulla Dolly   TUBAL LIGATION 2005   Past Family History: Family History  Problem Relation Age of Onset   Colon cancer Mother   Epilepsy Mother   Osteoporosis (Thinning of bones) Mother   Macular degeneration Mother   Deep vein thrombosis (DVT or abnormal blood clot formation) Mother   High blood pressure (Hypertension) Father   Heart disease Father   Glaucoma Father   Bipolar disorder Sister   Lymphoma Sister   Breast  cancer Maternal Grandmother   Diabetes type II Paternal Grandmother   Diabetes type II Paternal Grandfather   Medications: Current Outpatient Medications Ordered in Epic  Medication Sig Dispense Refill   fluticasone propionate (FLONASE) 50 mcg/actuation nasal spray Place into one nostril   L.acid/L.casei/B.bif/B.lon/FOS (PROBIOTIC BLEND ORAL) Take by mouth   multivitamin capsule Take 1 capsule by mouth once daily   No current Epic-ordered facility-administered medications on file.   Allergies: No Known Allergies   Body mass index is 40.73 kg/m.  Review of Systems: A comprehensive 14 point ROS was performed, reviewed, and the pertinent orthopaedic findings are documented in the HPI.  Vitals:  09/01/20 0834  BP: (!) 156/88    General Physical Examination:   General/Constitutional: No apparent distress: well-nourished and well developed. Eyes: Pupils equal, round with synchronous movement. Lungs: Clear to auscultation HEENT: Normal Vascular: No edema, swelling or tenderness, except as noted in detailed exam. Cardiac: Heart rate and rhythm is regular. Integumentary: No impressive skin lesions present, except as noted in detailed exam. Neuro/Psych: Normal mood and affect, oriented to person, place, and time.  Musculoskeletal Examination:  On exam, right knee crepitus.  Radiographs:  AP, lateral, standing, and sunrise x-rays of the right knee were ordered and personally reviewed today. These show severe lateral compartment osteoarthritis, severe valgus deformity. There is lateral subluxation of the tibia, severe patellofemoral degenerative change, and significant subluxation of the patella with complete loss of  lateral joint space and significant bony erosion of the lateral facet that is present on both knees on the sunrise view.  X-ray Impression Severe osteoarthritis with valgus deformity, right knee.  Assessment: ICD-10-CM  1. Primary osteoarthritis of right knee  M17.11  2. Acute pain of right knee M25.561   Plan: Right TKA  Scribe Attestation: I, Dawn Royse, am acting as scribe for Lauris Poag, MD.   Electronically signed by Lauris Poag, MD at 09/02/2020 12:55 PM EDT  Reviewed  H+P. No changes noted.

## 2020-09-23 NOTE — Progress Notes (Signed)
Dr. Rosey Bath called and at bedside. Patient c/o's RLE pain, behind knee and "my entire knee" Discussed block, but at this time will administered IV dilaudid and monitor effects.  Patient aware and verbalizes understanding of plan of care.

## 2020-09-23 NOTE — Op Note (Signed)
09/23/2020  12:35 PM  PATIENT:  Kendra Kim   MRN: [id number]  PRE-OPERATIVE DIAGNOSIS:  Primary localized osteoarthritis of right knee   POST-OPERATIVE DIAGNOSIS:  Same   PROCEDURE:  Procedure(s): Right TOTAL KNEE ARTHROPLASTY   SURGEON: Laurene Footman, MD   ASSISTANTS: Rachelle Hora, PA-C   ANESTHESIA:   spinal   EBL: 100 cc   BLOOD ADMINISTERED:none   DRAINS:  Incisional wound VAC     LOCAL MEDICATIONS USED:  MARCAINE    and OTHER Exparel and morphine   SPECIMEN:  No Specimen   DISPOSITION OF SPECIMEN:  N/A   COUNTS:  YES   TOURNIQUET: 59 minutes at 300 mm Hg   IMPLANTS: Medacta  GMK  system with 2 PS femur, 2 tibia with short stem and 10 mm PS insert.  Size 2 patella, all components cemented.   DICTATION: Viviann Spare Dictation   patient was brought to the operating room and spinal anesthesia was obtained.  After prepping and draping the right leg in sterile fashion, and after patient identification and timeout procedures were completed, tourniquet was raised  and midline skin incision was made followed by medial parapatellar arthrotomy with mild medial compartment osteoarthritis, severe patellofemoral arthritis and severe lateral compartment arthritis, partial synovectomy was also carried out.   The ACL and PCL and fat pad were excised along with anterior horns of the meniscus. The proximal tibia cutting guide from  the extra medullary system was applied and the proximal tibia cut carried out.  The distal femoral cut was carried out in a similar fashion using an intramedullary guide after sizing,     the 2 femoral cutting guide applied with anterior posterior and chamfer cuts made.  The posterior horns of the menisci were removed at this point.   Injection of the above medication was carried out after the femoral and tibial cuts were carried out.  The 2 baseplate trial was placed pinned into position and proximal tibial preparation carried out with drilling hand reaming and  the keel punch followed by placement of the 2 femur and sizing the tibial insert size 10 millimeter gave the best fit with stability and full extension.  The distal femoral drill holes were made in the notch cut for the trochlear groove was then carried out with trials were then removed the patella was cut using the patellar cutting guide and it sized to a size 2 after drill holes have been made  The knee was irrigated with pulsatile lavage and the bony surfaces dried the tibial component was cemented into place first.  Excess cement was removed and the polyethylene insert placed with a torque screw placed with a torque screwdriver tightened.  The distal femoral component was placed and the knee was held in extension as the patellar button was clamped into place.  After the cement was set, excess cement was removed and the knee was again irrigated thoroughly thoroughly irrigated.  The tourniquet was let down and hemostasis checked with electrocautery. The arthrotomy was repaired with a heavy Quill suture,  followed by 3-0 V lock subcuticular closure, skin staples followed by incisional wound VAC and Polar Care.Marland Kitchen   PLAN OF CARE: Admit for overnight observation   PATIENT DISPOSITION:  PACU - hemodynamically stable.

## 2020-09-23 NOTE — Anesthesia Procedure Notes (Signed)
Spinal  Patient location during procedure: OR Start time: 09/23/2020 10:18 AM End time: 09/23/2020 10:37 AM Reason for block: surgical anesthesia Staffing Performed: resident/CRNA and anesthesiologist  Anesthesiologist: Martha Clan, MD Resident/CRNA: Fredderick Phenix, CRNA Preanesthetic Checklist Completed: patient identified, IV checked, site marked, risks and benefits discussed, surgical consent, monitors and equipment checked, pre-op evaluation and timeout performed Spinal Block Patient position: sitting Prep: ChloraPrep Patient monitoring: heart rate, cardiac monitor, continuous pulse ox and blood pressure Approach: midline Location: L2-3 Injection technique: single-shot Needle Needle type: Sprotte  Needle gauge: 24 G Needle length: 9 cm Assessment Sensory level: T6 Events: CSF return Additional Notes IV functioning, monitors applied to pt. Expiration date of kit checked and confirmed to be in date. Sterile prep and drape, hand hygiene and sterile gloved used. Pt was positioned and spine was prepped in sterile fashion. Skin was anesthetized with lidocaine. Free flow of clear CSF obtained prior to injecting local anesthetic into CSF x 1 attempt. Spinal needle aspirated freely following injection. Needle was carefully withdrawn, and pt tolerated procedure well. Loss of motor and sensory on exam post injection.

## 2020-09-23 NOTE — Anesthesia Preprocedure Evaluation (Signed)
Anesthesia Evaluation  Patient identified by MRN, date of birth, ID band Patient awake    Reviewed: Allergy & Precautions, H&P , NPO status , Patient's Chart, lab work & pertinent test results  History of Anesthesia Complications Negative for: history of anesthetic complications  Airway Mallampati: III  TM Distance: <3 FB Neck ROM: full    Dental  (+) Chipped, Dental Advidsory Given, Teeth Intact   Pulmonary neg pulmonary ROS, neg shortness of breath, neg recent URI,    Pulmonary exam normal        Cardiovascular (-) angina(-) Past MI and (-) DOE negative cardio ROS Normal cardiovascular exam     Neuro/Psych negative neurological ROS  negative psych ROS   GI/Hepatic negative GI ROS, Neg liver ROS, neg GERD  ,  Endo/Other  neg diabetesMorbid obesity  Renal/GU negative Renal ROS  negative genitourinary   Musculoskeletal   Abdominal   Peds  Hematology negative hematology ROS (+)   Anesthesia Other Findings History reviewed. No pertinent past medical history.  Past Surgical History: No date: FOOT SURGERY; Right     Comment:  x 2, realigned 2nd toe and then fused, by podiatrist (Dr              Paulla Dolly) 2005: TUBAL LIGATION  BMI    Body Mass Index: 43.46 kg/m      Reproductive/Obstetrics negative OB ROS                             Anesthesia Physical  Anesthesia Plan  ASA: 3  Anesthesia Plan: Spinal   Post-op Pain Management:    Induction: Intravenous  PONV Risk Score and Plan: Propofol infusion and TIVA  Airway Management Planned: Natural Airway and Simple Face Mask  Additional Equipment:   Intra-op Plan:   Post-operative Plan:   Informed Consent: I have reviewed the patients History and Physical, chart, labs and discussed the procedure including the risks, benefits and alternatives for the proposed anesthesia with the patient or authorized representative who has  indicated his/her understanding and acceptance.     Dental Advisory Given  Plan Discussed with: Anesthesiologist, CRNA and Surgeon  Anesthesia Plan Comments: (Patient consented for risks of anesthesia including but not limited to:  - adverse reactions to medications - risk of intubation if required - damage to eyes, teeth, lips or other oral mucosa - nerve damage due to positioning  - sore throat or hoarseness - Damage to heart, brain, nerves, lungs, other parts of body or loss of life  Patient voiced understanding.)        Anesthesia Quick Evaluation

## 2020-09-23 NOTE — Progress Notes (Signed)
Patient awake, alert x4. Spinal anesthesia has worn off, able to move bil lower ext. C/o's of pain behind right knee. Medicated as ordered, +CMS to bil lower ext. Will continue to monitor.

## 2020-09-23 NOTE — Transfer of Care (Signed)
Immediate Anesthesia Transfer of Care Note  Patient: Kendra Kim  Procedure(s) Performed: TOTAL KNEE ARTHROPLASTY (Right: Knee)  Patient Location: PACU  Anesthesia Type:Spinal  Level of Consciousness: awake and alert   Airway & Oxygen Therapy: Patient Spontanous Breathing  Post-op Assessment: Report given to RN and Post -op Vital signs reviewed and stable  Post vital signs: Reviewed and stable  Last Vitals:  Vitals Value Taken Time  BP 122/64 09/23/20 1232  Temp    Pulse 88 09/23/20 1233  Resp 17 09/23/20 1233  SpO2 100 % 09/23/20 1233  Vitals shown include unvalidated device data.  Last Pain:  Vitals:   09/23/20 0850  TempSrc: Tympanic  PainSc: 0-No pain      Patients Stated Pain Goal: 0 (77/93/96 [id number])  Complications: No notable events documented.

## 2020-09-24 ENCOUNTER — Encounter: Payer: Self-pay | Admitting: Orthopedic Surgery

## 2020-09-24 DIAGNOSIS — M1711 Unilateral primary osteoarthritis, right knee: Secondary | ICD-10-CM | POA: Diagnosis not present

## 2020-09-24 LAB — CBC
HCT: 37.6 % (ref 36.0–46.0)
Hemoglobin: 13 g/dL (ref 12.0–15.0)
MCH: 31 pg (ref 26.0–34.0)
MCHC: 34.6 g/dL (ref 30.0–36.0)
MCV: 89.5 fL (ref 80.0–100.0)
Platelets: 282 10*3/uL (ref 150–400)
RBC: 4.2 MIL/uL (ref 3.87–5.11)
RDW: 13.3 % (ref 11.5–15.5)
WBC: 10.9 10*3/uL — ABNORMAL HIGH (ref 4.0–10.5)
nRBC: 0 % (ref 0.0–0.2)

## 2020-09-24 LAB — BASIC METABOLIC PANEL
Anion gap: 9 (ref 5–15)
BUN: 8 mg/dL (ref 6–20)
CO2: 23 mmol/L (ref 22–32)
Calcium: 8.4 mg/dL — ABNORMAL LOW (ref 8.9–10.3)
Chloride: 101 mmol/L (ref 98–111)
Creatinine, Ser: 0.6 mg/dL (ref 0.44–1.00)
GFR, Estimated: >60 mL/min (ref >60)
Glucose, Bld: 166 mg/dL — ABNORMAL HIGH (ref 70–99)
Potassium: 3.8 mmol/L (ref 3.5–5.1)
Sodium: 133 mmol/L — ABNORMAL LOW (ref 135–145)

## 2020-09-24 MED ORDER — DOCUSATE SODIUM 100 MG PO CAPS: 100.0000 mg | ORAL_CAPSULE | Freq: Two times a day (BID) | ORAL | 0 refills | Status: DC

## 2020-09-24 MED ORDER — POLYETHYLENE GLYCOL 3350 17 G PO PACK
17.0000 g | PACK | Freq: Every day | ORAL | 0 refills | Status: DC | PRN
Start: 2020-09-24 — End: 2021-06-13

## 2020-09-24 MED ORDER — METHOCARBAMOL 500 MG PO TABS: 500.0000 mg | ORAL_TABLET | Freq: Four times a day (QID) | ORAL | 0 refills | Status: DC | PRN

## 2020-09-24 MED ORDER — CLONIDINE HCL 0.1 MG PO TABS
0.2000 mg | ORAL_TABLET | ORAL | Status: AC
  Administered 2020-09-24: 0.2 mg via ORAL
  Filled 2020-09-24: qty 2

## 2020-09-24 MED ORDER — TRAMADOL HCL 50 MG PO TABS: 50.0000 mg | ORAL_TABLET | Freq: Four times a day (QID) | ORAL | 0 refills | Status: DC | PRN

## 2020-09-24 MED ORDER — HYDROCODONE-ACETAMINOPHEN 7.5-325 MG PO TABS: 1.0000 | ORAL_TABLET | ORAL | 0 refills | Status: DC | PRN

## 2020-09-24 MED ORDER — ENOXAPARIN SODIUM 40 MG/0.4ML IJ SOSY: 40.0000 mg | PREFILLED_SYRINGE | INTRAMUSCULAR | 0 refills | Status: DC

## 2020-09-24 NOTE — Progress Notes (Signed)
Physical Therapy Treatment Patient Details Name: Kendra Kim MRN: [id number] DOB: 12-29-1970 Today's Date: 09/24/2020   History of Present Illness Pt admitted for R TKR and is POD 1 at time of evaluation.    PT Comments    Pt is making gradual progress towards goals with slow and effortable gait pattern. RW used and cues for keeping correct distance from AD. Limited by pain. Will continue to progress as able. Hoping for dc tomorrow, will need stair training.  Recommendations for follow up therapy are one component of a multi-disciplinary discharge planning process, led by the attending physician.  Recommendations may be updated based on patient status, additional functional criteria and insurance authorization.  Follow Up Recommendations  Home health PT     Equipment Recommendations  3in1 (PT)    Recommendations for Other Services       Precautions / Restrictions Precautions Precautions: Fall;Knee Precaution Booklet Issued: Yes (comment) Restrictions Weight Bearing Restrictions: Yes RLE Weight Bearing: Weight bearing as tolerated     Mobility  Bed Mobility Overal bed mobility: Needs Assistance Bed Mobility: Supine to Sit;Sit to Supine     Supine to sit: Min assist Sit to supine: Min assist   General bed mobility comments: needs assist for LE sequencing. Heavy cues for mobility assist    Transfers Overall transfer level: Needs assistance Equipment used: Rolling walker (2 wheeled) Transfers: Sit to/from Stand Sit to Stand: Min guard         General transfer comment: bed elevated to improve ease. Slow and effortful movement  Ambulation/Gait Ambulation/Gait assistance: Min guard Gait Distance (Feet): 80 Feet Assistive device: Rolling walker (2 wheeled) Gait Pattern/deviations: Step-to pattern     General Gait Details: slow gait pattern with reciprocal gait. Fatigues quickly and pain limited   Stairs             Wheelchair Mobility     Modified Rankin (Stroke Patients Only)       Balance Overall balance assessment: Needs assistance Sitting-balance support: Feet supported;No upper extremity supported Sitting balance-Leahy Scale: Good     Standing balance support: Bilateral upper extremity supported Standing balance-Leahy Scale: Good                              Cognition Arousal/Alertness: Awake/alert Behavior During Therapy: WFL for tasks assessed/performed Overall Cognitive Status: Within Functional Limits for tasks assessed                                        Exercises Total Joint Exercises Goniometric ROM: R knee 66 degrees of flexion Other Exercises Other Exercises: supine ther-ex performed on R LE including AP, QS, SLRs, and hip ab/add. 12 reps and reviewed written HEP    General Comments        Pertinent Vitals/Pain Pain Assessment: 0-10 Pain Score: 4  Pain Location: R knee Pain Descriptors / Indicators: Operative site guarding Pain Intervention(s): Limited activity within patient's tolerance;Ice applied;Repositioned;Premedicated before session    Home Living                      Prior Function            PT Goals (current goals can now be found in the care plan section) Acute Rehab PT Goals Patient Stated Goal: to go home PT Goal Formulation: With patient Time  For Goal Achievement: 10/08/20 Potential to Achieve Goals: Good Progress towards PT goals: Progressing toward goals    Frequency    BID      PT Plan Current plan remains appropriate    Co-evaluation              AM-PAC PT "6 Clicks" Mobility   Outcome Measure  Help needed turning from your back to your side while in a flat bed without using bedrails?: A Little Help needed moving from lying on your back to sitting on the side of a flat bed without using bedrails?: A Little Help needed moving to and from a bed to a chair (including a wheelchair)?: A Little Help needed  standing up from a chair using your arms (e.g., wheelchair or bedside chair)?: A Little Help needed to walk in hospital room?: A Little Help needed climbing 3-5 steps with a railing? : A Lot 6 Click Score: 17    End of Session Equipment Utilized During Treatment: Gait belt Activity Tolerance: Patient limited by pain Patient left: in bed;with bed alarm set;with SCD's reapplied;with family/visitor present Nurse Communication: Mobility status PT Visit Diagnosis: Muscle weakness (generalized) (M62.81);Difficulty in walking, not elsewhere classified (R26.2);Pain Pain - Right/Left: Right Pain - part of body: Knee     Time: 1351-1429 PT Time Calculation (min) (ACUTE ONLY): 38 min  Charges:  $Gait Training: 23-37 mins $Therapeutic Exercise: 8-22 mins                     Greggory Stallion, PT, DPT [phone]    Kendra Kim 09/24/2020, 4:50 PM

## 2020-09-24 NOTE — Discharge Summary (Signed)
Physician Discharge Summary  Patient ID: Kendra Kim MRN: [id number] DOB/AGE: [birth year] 50 y.o.  Admit date: 09/23/2020 Discharge date: 09/25/20  Admission Diagnoses:  S/P TKR (total knee replacement) using cement, right [Z96.651]   Discharge Diagnoses: Patient Active Problem List   Diagnosis Date Noted   S/P TKR (total knee replacement) using cement, right 09/23/2020   Perimenopausal 05/03/2020   Family history of colon cancer in mother    Venous insufficiency of both lower extremities 11/08/2016   Morbid obesity ([place]) 11/08/2016   Knee osteoarthritis 11/08/2016    History reviewed. No pertinent past medical history.   Transfusion: none   Consultants (if any):   Discharged Condition: Improved  Hospital Course: NADYNE Kim is an 50 y.o. female who was admitted 09/23/2020 with a diagnosis of <principal problem not specified> and went to the operating room on 09/23/2020 and underwent the above named procedures.    Surgeries: Procedure(s): TOTAL KNEE ARTHROPLASTY on 09/23/2020 Patient tolerated the surgery well. Taken to PACU where she was stabilized and then transferred to the orthopedic floor.  Started on Lovenox 30 mg q 12 hrs. Foot pumps applied bilaterally at 80 mm. Heels elevated on bed with rolled towels. No evidence of DVT. Negative Homan. Physical therapy started on day #1 for gait training and transfer. OT started day #1 for ADL and assisted devices.  Patient's foley was d/c on day #1.  On postop day 1.  Patient doing well, pain controlled.  Patient noted to have slight hypertension.  Clonidine ordered.  Patient was able to ambulate 20 feet with physical therapy.  On POD2, Hypertension persisted, Clonidine ordered and prn hydralazine given to the patient.  Implants: Medacta  GMK  system with 2 PS femur, 2 tibia with short stem and 10 mm PS insert.  Size 2 patella, all components cemented  She was given perioperative antibiotics:  Anti-infectives (From  admission, onward)    Start     Dose/Rate Route Frequency Ordered Stop   09/23/20 1600  ceFAZolin (ANCEF) IVPB 2g/100 mL premix        2 g 200 mL/hr over 30 Minutes Intravenous Every 6 hours 09/23/20 1528 09/24/20 0511   09/23/20 0904  ceFAZolin (ANCEF) 2-4 GM/100ML-% IVPB       Note to Pharmacy: [place] [facility], [facility]: cabinet override      09/23/20 0904 09/24/20 0511   09/23/20 0745  ceFAZolin (ANCEF) IVPB 2g/100 mL premix        2 g 200 mL/hr over 30 Minutes Intravenous On call to O.R. 09/23/20 0740 09/23/20 1049     .  She was given sequential compression devices, early ambulation, and Lovenox for DVT prophylaxis.  She benefited maximally from the hospital stay and there were no complications.    Recent vital signs:  Vitals:   09/25/20 0444 09/25/20 0819  BP: (!) 165/76 (!) 175/85  Pulse: 99 92  Resp: 20 16  Temp: 99 F (37.2 C) 98.8 F (37.1 C)  SpO2: 100% 97%    Recent laboratory studies:  Lab Results  Component Value Date   HGB 13.0 09/24/2020   HGB 13.5 09/23/2020   HGB 13.5 09/13/2020   Lab Results  Component Value Date   WBC 10.9 (H) 09/24/2020   PLT 282 09/24/2020   No results found for: INR Lab Results  Component Value Date   NA 133 (L) 09/24/2020   K 3.8 09/24/2020   CL 101 09/24/2020   CO2 23 09/24/2020   BUN 8 09/24/2020  CREATININE 0.60 09/24/2020   GLUCOSE 166 (H) 09/24/2020    Discharge Medications:   Allergies as of 09/25/2020   No Known Allergies      Medication List     TAKE these medications    BIOTIN PO Take 1 tablet by mouth daily.   calcium carbonate 500 MG chewable tablet Commonly known as: TUMS - dosed in mg elemental calcium Chew 1 tablet by mouth daily as needed for indigestion or heartburn.   docusate sodium 100 MG capsule Commonly known as: COLACE Take 1 capsule (100 mg total) by mouth 2 (two) times daily.   enoxaparin 40 MG/0.4ML injection Commonly known as: LOVENOX Inject 0.4 mLs (40 mg total) into the skin  daily.   hydrALAZINE 10 MG tablet Commonly known as: APRESOLINE Take 1 tablet (10 mg total) by mouth 2 (two) times daily as needed (Systolic BP above 258).   HYDROcodone-acetaminophen 7.5-325 MG tablet Commonly known as: NORCO Take 1-2 tablets by mouth every 4 (four) hours as needed for severe pain (pain score 7-10).   methocarbamol 500 MG tablet Commonly known as: ROBAXIN Take 1 tablet (500 mg total) by mouth every 6 (six) hours as needed for muscle spasms.   multivitamin capsule Take 1 capsule daily by mouth.   polyethylene glycol 17 g packet Commonly known as: MIRALAX / GLYCOLAX Take 17 g by mouth daily as needed for mild constipation.   PROBIOTIC DAILY PO Take 1 capsule by mouth daily.   traMADol 50 MG tablet Commonly known as: ULTRAM Take 1 tablet (50 mg total) by mouth every 6 (six) hours as needed.               Durable Medical Equipment  (From admission, onward)           Start     Ordered   09/23/20 1901  DME Walker rolling  Once       Question Answer Comment  Walker: With 5 Inch Wheels   Patient needs a walker to treat with the following condition S/P TKR (total knee replacement) using cement, right      09/23/20 1900   09/23/20 1901  DME 3 n 1  Once        09/23/20 1900   09/23/20 1901  DME Bedside commode  Once       Question:  Patient needs a bedside commode to treat with the following condition  Answer:  S/P TKR (total knee replacement) using cement, right   09/23/20 1900            Diagnostic Studies: DG Knee 1-2 Views Right  Result Date: 09/23/2020 CLINICAL DATA:  Status post right total knee arthroplasty EXAM: RIGHT KNEE - 1-2 VIEW COMPARISON:  None. FINDINGS: Status post right total knee arthroplasty with well-positioned distal right femoral and proximal right tibial prostheses. Anterior midline skin staples noted. Expected gas within and surrounding the right knee joint. No bone fracture or dislocation. No suspicious focal osseous  lesions. IMPRESSION: Satisfactory immediate postoperative appearance status post right total knee arthroplasty. Electronically Signed   By: Ilona Sorrel M.D.   On: 09/23/2020 13:29    Disposition: Plan for discharge home today pending progress with therapy.   Follow-up Information     Duanne Guess, PA-C Follow up in 2 week(s).   Specialties: Orthopedic Surgery, Emergency Medicine Contact information: [place] [address] [phone]                Signed: Joyice Faster  Trooper Olander PA-C 09/25/2020, 9:29 AM

## 2020-09-24 NOTE — Plan of Care (Signed)
Patient alert and oriented x 4, complains of pain to right surgical knee, relieved with prn and scheduled pain medications. Foley removed early am, patient due to void. Vitals stable, no respiratory distress on room air. Dressing clean, dry and intact to site, polar care in place. No drainage noted to wound vac. Will continue to monitor.  Problem: Education: Goal: Knowledge of General Education information will improve Description: Including pain rating scale, medication(s)/side effects and non-pharmacologic comfort measures Outcome: Progressing   Problem: Health Behavior/Discharge Planning: Goal: Ability to manage health-related needs will improve Outcome: Progressing   Problem: Clinical Measurements: Goal: Ability to maintain clinical measurements within normal limits will improve Outcome: Progressing Goal: Will remain free from infection Outcome: Progressing Goal: Diagnostic test results will improve Outcome: Progressing Goal: Respiratory complications will improve Outcome: Progressing Goal: Cardiovascular complication will be avoided Outcome: Progressing   Problem: Activity: Goal: Risk for activity intolerance will decrease Outcome: Progressing   Problem: Nutrition: Goal: Adequate nutrition will be maintained Outcome: Progressing   Problem: Coping: Goal: Level of anxiety will decrease Outcome: Progressing   Problem: Elimination: Goal: Will not experience complications related to bowel motility Outcome: Progressing Goal: Will not experience complications related to urinary retention Outcome: Progressing

## 2020-09-24 NOTE — Progress Notes (Signed)
   Subjective: 1 Day Post-Op Procedure(s) (LRB): TOTAL KNEE ARTHROPLASTY (Right) Patient reports pain as mild and moderate.   Patient is well, and has had no acute complaints or problems Denies any CP, SOB, ABD pain. We will continue therapy today.  Plan is to go Home after hospital stay.  Objective: Vital signs in last 24 hours: Temp:  [97.4 F (36.3 C)-98.3 F (36.8 C)] 98.2 F (36.8 C) (09/23 0454) Pulse Rate:  [64-97] 94 (09/23 0454) Resp:  [13-21] 16 (09/23 0454) BP: (101-183)/(54-96) 169/89 (09/23 0454) SpO2:  [95 %-100 %] 100 % (09/23 0454) Weight:  [106.2 kg] 106.2 kg (09/22 0850)  Intake/Output from previous day: 09/22 0701 - 09/23 0700 In: 2025 [P.O.:125; I.V.:1800; IV Piggyback:100] Out: 2100 [Urine:1900; Emesis/NG output:100; Blood:100] Intake/Output this shift: No intake/output data recorded.  Recent Labs    09/23/20 0932 09/24/20 0454  HGB 13.5 13.0   Recent Labs    09/23/20 0932 09/24/20 0454  WBC 6.2 10.9*  RBC 4.54 4.20  HCT 41.4 37.6  PLT 320 282   Recent Labs    09/23/20 0932 09/24/20 0454  NA  --  133*  K  --  3.8  CL  --  101  CO2  --  23  BUN  --  8  CREATININE 0.68 0.60  GLUCOSE  --  166*  CALCIUM  --  8.4*   No results for input(s): LABPT, INR in the last 72 hours.  EXAM General - Patient is Alert, Appropriate, and Oriented Extremity - Neurovascular intact Sensation intact distally Intact pulses distally Dorsiflexion/Plantar flexion intact Incision: dressing C/D/I and no drainage No cellulitis present Compartment soft Dressing - dressing C/D/I and no drainage, Prevena intact without drainage Motor Function - intact, moving foot and toes well on exam.   History reviewed. No pertinent past medical history.  Assessment/Plan:   1 Day Post-Op Procedure(s) (LRB): TOTAL KNEE ARTHROPLASTY (Right) Active Problems:   S/P TKR (total knee replacement) using cement, right  Estimated body mass index is 42.82 kg/m as calculated  from the following:   Height as of this encounter: 5\' 2"  (1.575 m).   Weight as of this encounter: 106.2 kg. Advance diet Up with therapy Pain controlled Labs stable Vital signs are stable Work on bowel movement Care manager to assist with discharge to home with home health physical therapy pending completion of PT goals.  Plan on discharge to home today or tomorrow  DVT Prophylaxis - Lovenox, Foot Pumps, and TED hose Weight-Bearing as tolerated to right leg   T. Rachelle Hora, PA-C [place] 09/24/2020, 8:15 AM

## 2020-09-24 NOTE — Discharge Instructions (Addendum)
Kendra Kim  Knee Rehabilitation, Guidelines Following Surgery  Results after knee surgery are often greatly improved when you follow the exercise, range of motion and muscle strengthening exercises prescribed by your doctor. Safety measures are also important to protect the knee from further injury. Any time any of these exercises cause you to have increased pain or swelling in your knee joint, decrease the amount until you are comfortable again and slowly increase them. If you have problems or questions, call your caregiver or physical therapist for advice.   HOME CARE INSTRUCTIONS  Remove items at home which could result in a fall. This includes throw rugs or furniture in walking pathways.  Continue to use polar care unit on the knee for pain and swelling from surgery. You may notice swelling that will progress down to the foot and ankle.  This is normal after surgery.  Elevate the leg when you are not up walking on it.   Continue to use the breathing machine which will help keep your temperature down.  It is common for your temperature to cycle up and down following surgery, especially at night when you are not up moving around and exerting yourself.  The breathing machine keeps your lungs expanded and your temperature down. Do not place pillow under knee, focus on keeping the knee STRAIGHT while resting  DIET You may resume your previous home diet once your are discharged from the hospital.  DRESSING / WOUND CARE / SHOWERING Please remove provena negative pressure dressing on 10/01/2020 and apply honey comb dressing. Keep dressing clean and dry at all times.   ACTIVITY Walk with your walker as instructed. Use walker as long as suggested by your caregivers. Avoid periods of inactivity such as sitting longer than an hour when not asleep. This helps prevent blood clots.  You may resume a sexual relationship in one month or when given the OK by your  doctor.  You may return to work once you are cleared by your doctor.  Do not drive a car for 6 weeks or until released by you surgeon.  Do not drive while taking narcotics.  WEIGHT BEARING Weight bearing as tolerated with assist device (walker, cane, etc) as directed, use it as long as suggested by your surgeon or therapist, typically at least 4-6 weeks.  POSTOPERATIVE CONSTIPATION PROTOCOL Constipation - defined medically as fewer than three stools per week and severe constipation as less than one stool per week.  One of the most common issues patients have following surgery is constipation.  Even if you have a regular bowel pattern at home, your normal regimen is likely to be disrupted due to multiple reasons following surgery.  Combination of anesthesia, postoperative narcotics, change in appetite and fluid intake all can affect your bowels.  In order to avoid complications following surgery, here are some recommendations in order to help you during your recovery period.  Colace (docusate) - Pick up an over-the-counter form of Colace or another stool softener and take twice a day as long as you are requiring postoperative pain medications.  Take with a full glass of water daily.  If you experience loose stools or diarrhea, hold the colace until you stool forms back up.  If your symptoms do not get better within 1 week or if they get worse, check with your doctor.  Dulcolax (bisacodyl) - Pick up over-the-counter and take as directed by the product packaging as needed to assist with the movement of your bowels.  Take with a full glass of water.  Use this product as needed if not relieved by Colace only.   MiraLax (polyethylene glycol) - Pick up over-the-counter to have on hand.  MiraLax is a solution that will increase the amount of water in your bowels to assist with bowel movements.  Take as directed and can mix with a glass of water, juice, soda, coffee, or tea.  Take if you go more than two  days without a movement. Do not use MiraLax more than once per day. Call your doctor if you are still constipated or irregular after using this medication for 7 days in a row.  If you continue to have problems with postoperative constipation, please contact the office for further assistance and recommendations.  If you experience "the worst abdominal pain ever" or develop nausea or vomiting, please contact the office immediatly for further recommendations for treatment.  ITCHING  If you experience itching with your medications, try taking only a single pain pill, or even half a pain pill at a time.  You can also use Benadryl over the counter for itching or also to help with sleep.   TED HOSE STOCKINGS Wear the elastic stockings on both legs for six weeks following surgery during the day but you may remove then at night for sleeping.  MEDICATIONS See your medication summary on the "After Visit Summary" that the nursing staff will review with you prior to discharge.  You may have some home medications which will be placed on hold until you complete the course of blood thinner medication.  It is important for you to complete the blood thinner medication as prescribed by your surgeon.  Continue your approved medications as instructed at time of discharge.  PRECAUTIONS If you experience chest pain or shortness of breath - call 911 immediately for transfer to the hospital emergency department.  If you develop a fever greater that 101 F, purulent drainage from wound, increased redness or drainage from wound, foul odor from the wound/dressing, or calf pain - CONTACT YOUR SURGEON.                                                   FOLLOW-UP APPOINTMENTS Make sure you keep all of your appointments after your operation with your surgeon and caregivers. You should call the office at the above phone number and make an appointment for approximately two weeks after the date of your surgery or on the date  instructed by your surgeon outlined in the "After Visit Summary".   RANGE OF MOTION AND STRENGTHENING EXERCISES  Rehabilitation of the knee is important following a knee injury or an operation. After just a few days of immobilization, the muscles of the thigh which control the knee become weakened and shrink (atrophy). Knee exercises are designed to build up the tone and strength of the thigh muscles and to improve knee motion. Often times heat used for twenty to thirty minutes before working out will loosen up your tissues and help with improving the range of motion but do not use heat for the first two weeks following surgery. These exercises can be done on a training (exercise) mat, on the floor, on a table or on a bed. Use what ever works the best and is most comfortable for you Knee exercises include:  Leg Lifts - While your  knee is still immobilized in a splint or cast, you can do straight leg raises. Lift the leg to 60 degrees, hold for 3 sec, and slowly lower the leg. Repeat 10-20 times 2-3 times daily. Perform this exercise against resistance later as your knee gets better.  Quad and Hamstring Sets - Tighten up the muscle on the front of the thigh (Quad) and hold for 5-10 sec. Repeat this 10-20 times hourly. Hamstring sets are done by pushing the foot backward against an object and holding for 5-10 sec. Repeat as with quad sets.  Leg Slides: Lying on your back, slowly slide your foot toward your buttocks, bending your knee up off the floor (only go as far as is comfortable). Then slowly slide your foot back down until your leg is flat on the floor again. Angel Wings: Lying on your back spread your legs to the side as far apart as you can without causing discomfort.  A rehabilitation program following serious knee injuries can speed recovery and prevent re-injury in the future due to weakened muscles. Contact your doctor or a physical therapist for more information on knee rehabilitation.   IF YOU  ARE TRANSFERRED TO A SKILLED REHAB FACILITY If the patient is transferred to a skilled rehab facility following release from the hospital, a list of the current medications will be sent to the facility for the patient to continue.  When discharged from the skilled rehab facility, please have the facility set up the patient's [place] prior to being released. Also, the skilled facility will be responsible for providing the patient with their medications at time of release from the facility to include their pain medication, the muscle relaxants, and their blood thinner medication. If the patient is still at the rehab facility at time of the two week follow up appointment, the skilled rehab facility will also need to assist the patient in arranging follow up appointment in our office and any transportation needs.  MAKE SURE YOU:  Understand these instructions.  Get help right away if you are not doing well or get worse.   Purchase a BP cuff to monitor your blood pressure at home.  If when you check your blood pressure and the higher number is above 150, take the hydralazine that I have discharged you home with up to twice a day.  Contact your PCP on Monday if you are still having some difficulty with your blood pressure for follow-up.

## 2020-09-24 NOTE — Anesthesia Postprocedure Evaluation (Signed)
Anesthesia Post Note  Patient: Kendra Kim  Procedure(s) Performed: TOTAL KNEE ARTHROPLASTY (Right: Knee)  Patient location during evaluation: Nursing Unit Anesthesia Type: Spinal Level of consciousness: oriented and awake and alert Pain management: pain level controlled Vital Signs Assessment: post-procedure vital signs reviewed and stable Respiratory status: spontaneous breathing and respiratory function stable Cardiovascular status: blood pressure returned to baseline and stable Postop Assessment: no headache, no backache, no apparent nausea or vomiting and patient able to bend at knees Anesthetic complications: no   No notable events documented.   Last Vitals:  Vitals:   09/24/20 0454 09/24/20 0857  BP: (!) 169/89 (!) 176/87  Pulse: 94 86  Resp: 16 17  Temp: 36.8 C 36.6 C  SpO2: 100%     Last Pain:  Vitals:   09/24/20 0857  TempSrc: Oral  PainSc:                  Estill Batten

## 2020-09-24 NOTE — Progress Notes (Signed)
Met with the patient and her husband in the room, She has transportation and can afford her medication, she is set up with [facility] for HH, she has a cane and RW at home and needs a 3 in 1, [employer] to provide  

## 2020-09-24 NOTE — Evaluation (Signed)
Physical Therapy Evaluation Patient Details Name: Kendra Kim MRN: [id number] DOB: 1970/07/20 Today's Date: 09/24/2020  History of Present Illness  Pt admitted for R TKR and is POD 1 at time of evaluation.  Clinical Impression  Pt is a pleasant 50 year old female who was admitted for R TKR. Pt performs bed mobility with min assist, transfers with mod assist, and ambulation with cga and RW. Pt demonstrates ability to perform 10 SLRs with independence, therefore does not require KI for mobility. Pt demonstrates deficits with strength/mobility/ambulation/pain. Would benefit from skilled PT to address above deficits and promote optimal return to PLOF. Recommend transition to [facility] upon discharge from acute hospitalization.      Recommendations for follow up therapy are one component of a multi-disciplinary discharge planning process, led by the attending physician.  Recommendations may be updated based on patient status, additional functional criteria and insurance authorization.  Follow Up Recommendations Home health PT    Equipment Recommendations  3in1 (PT)    Recommendations for Other Services       Precautions / Restrictions Precautions Precautions: Fall;Knee Precaution Booklet Issued: No Restrictions Weight Bearing Restrictions: Yes RLE Weight Bearing: Weight bearing as tolerated      Mobility  Bed Mobility Overal bed mobility: Needs Assistance Bed Mobility: Supine to Sit     Supine to sit: Min assist     General bed mobility comments: slow transfer, pain limited. Once seated at EOB, needs assistance to lower leg to floor.    Transfers Overall transfer level: Needs assistance Equipment used: Rolling walker (2 wheeled) Transfers: Sit to/from Stand Sit to Stand: Mod assist         General transfer comment: bed elevated for successful transfer. Once standing, upright posture. Guarded and painful  Ambulation/Gait Ambulation/Gait assistance: Min guard Gait  Distance (Feet): 20 Feet Assistive device: Rolling walker (2 wheeled) Gait Pattern/deviations: Step-to pattern     General Gait Details: slow and effortful ambulation. Limited by pain and dizziness. Returned to chair as she felt dizzy. BP at 144/84 once seated. Handoff given to OT  Stairs            Wheelchair Mobility    Modified Rankin (Stroke Patients Only)       Balance Overall balance assessment: Needs assistance Sitting-balance support: Feet supported;Bilateral upper extremity supported Sitting balance-Leahy Scale: Good     Standing balance support: Bilateral upper extremity supported Standing balance-Leahy Scale: Good                               Pertinent Vitals/Pain Pain Assessment: 0-10 Pain Score: 6  Pain Location: R knee Pain Descriptors / Indicators: Operative site guarding Pain Intervention(s): Limited activity within patient's tolerance;Premedicated before session;Repositioned    Home Living Family/patient expects to be discharged to:: Private residence Living Arrangements: Spouse/significant other;Children Available Help at Discharge: Family;Available 24 hours/day Type of Home: House Home Access: Stairs to enter Entrance Stairs-Rails: None Entrance Stairs-Number of Steps: 2 Home Layout: One level Home Equipment: Walker - 2 wheels;Cane - single point      Prior Function Level of Independence: Independent         Comments: reports no use of AD prior to admission although was demonstrating antalgic gait pattern     Hand Dominance        Extremity/Trunk Assessment   Upper Extremity Assessment Upper Extremity Assessment: Overall WFL for tasks assessed    Lower Extremity Assessment Lower Extremity  Assessment: Generalized weakness (R LE grossly 3/5; L LE grossly 4/5)       Communication   Communication: No difficulties  Cognition Arousal/Alertness: Awake/alert Behavior During Therapy: WFL for tasks  assessed/performed Overall Cognitive Status: Within Functional Limits for tasks assessed                                        General Comments      Exercises Total Joint Exercises Goniometric ROM: full extension noted to R knee. Flexion will be obtained in PM session Other Exercises Other Exercises: supine ther-ex performed on R LE including AP, QS, GS, SLRs, and hip abd/add. 10 reps performed with min assist   Assessment/Plan    PT Assessment Patient needs continued PT services  PT Problem List Decreased strength;Decreased range of motion;Decreased balance;Decreased mobility;Pain       PT Treatment Interventions DME instruction;Gait training;Stair training;Therapeutic exercise    PT Goals (Current goals can be found in the Care Plan section)  Acute Rehab PT Goals Patient Stated Goal: to go home PT Goal Formulation: With patient Time For Goal Achievement: 10/08/20 Potential to Achieve Goals: Good    Frequency BID   Barriers to discharge        Co-evaluation               AM-PAC PT "6 Clicks" Mobility  Outcome Measure Help needed turning from your back to your side while in a flat bed without using bedrails?: A Little Help needed moving from lying on your back to sitting on the side of a flat bed without using bedrails?: A Little Help needed moving to and from a bed to a chair (including a wheelchair)?: A Little Help needed standing up from a chair using your arms (e.g., wheelchair or bedside chair)?: A Lot Help needed to walk in hospital room?: A Little Help needed climbing 3-5 steps with a railing? : A Lot 6 Click Score: 16    End of Session Equipment Utilized During Treatment: Gait belt Activity Tolerance: Patient limited by pain Patient left: in chair (left with OT) Nurse Communication: Mobility status PT Visit Diagnosis: Muscle weakness (generalized) (M62.81);Difficulty in walking, not elsewhere classified (R26.2);Pain Pain -  Right/Left: Right Pain - part of body: Knee    Time: 3428-7681 PT Time Calculation (min) (ACUTE ONLY): 38 min   Charges:   PT Evaluation $PT Eval Low Complexity: 1 Low PT Treatments $Gait Training: 8-22 mins $Therapeutic Exercise: 8-22 mins        Kendra Kim, PT, DPT [phone]   Kendra Kim 09/24/2020, 10:57 AM

## 2020-09-24 NOTE — Evaluation (Signed)
Occupational Therapy Evaluation Patient Details Name: Kendra Kim MRN: [id number] DOB: 01-20-1970 Today's Date: 09/24/2020   History of Present Illness Pt admitted for R TKR and is POD 1 at time of evaluation.   Clinical Impression   Kendra Kim was seen for OT evaluation this date. Prior to hospital admission, pt was Independent for mobility and ADLs. Pt lives with husband available 24/7. Pt presents to acute OT demonstrating impaired ADL performance and functional mobility 2/2 decreased activity tolerance and functional strength/ROM/balance deficits. Pt received in chair with PT, pt agreeable to session. Pt currently requires MIN A for LB access seated EOC. SBA + single UE support static standing. Pt instructed in polar care mgt, falls prevention strategies, home/routines modifications, DME/AE for LB bathing/dressing tasks, and compression stocking mgt. Handout provided.   Pt would benefit from skilled OT to address noted impairments and functional limitations (see below for any additional details) in order to maximize safety and independence while minimizing falls risk and caregiver burden. Upon hospital discharge, anticipate no OT follow up.  Orthostatic Vitals  Sitting: BP 144/84, MAP 101, HR 85 Standing: BP 141/87, MAP 99, HR 101        Recommendations for follow up therapy are one component of a multi-disciplinary discharge planning process, led by the attending physician.  Recommendations may be updated based on patient status, additional functional criteria and insurance authorization.   Follow Up Recommendations  No OT follow up    Equipment Recommendations  3 in 1 bedside commode    Recommendations for Other Services       Precautions / Restrictions Precautions Precautions: Fall;Knee Precaution Booklet Issued: No Restrictions Weight Bearing Restrictions: Yes RLE Weight Bearing: Weight bearing as tolerated      Mobility Bed Mobility Overal bed mobility: Needs  Assistance Bed Mobility: Supine to Sit     Supine to sit: Min assist     General bed mobility comments: received and left in chair    Transfers Overall transfer level: Needs assistance Equipment used: None Transfers: Sit to/from Stand Sit to Stand: Min guard;From elevated surface         General transfer comment: no AD to rise from chair, increased time    Balance Overall balance assessment: Needs assistance Sitting-balance support: Feet supported;No upper extremity supported Sitting balance-Leahy Scale: Good     Standing balance support: Single extremity supported;During functional activity Standing balance-Leahy Scale: Good                             ADL either performed or assessed with clinical judgement   ADL Overall ADL's : Needs assistance/impaired                                       General ADL Comments: MIN A for LB access seated EOC. SBA + single UE support static standing      Pertinent Vitals/Pain Pain Assessment: 0-10 Pain Score: 6  Pain Location: R knee Pain Descriptors / Indicators: Operative site guarding Pain Intervention(s): Limited activity within patient's tolerance     Hand Dominance     Extremity/Trunk Assessment Upper Extremity Assessment Upper Extremity Assessment: Overall WFL for tasks assessed   Lower Extremity Assessment Lower Extremity Assessment: Generalized weakness       Communication Communication Communication: No difficulties   Cognition Arousal/Alertness: Awake/alert Behavior During Therapy: WFL for tasks assessed/performed  Overall Cognitive Status: Within Functional Limits for tasks assessed                                     General Comments       Exercises Exercises: Other exercises Other Exercises Other Exercises: Pt educated re: OT role, DME recs, d/c recs, falls prevention, polar care, adapted dressing strategies, compression stockings mgmt Other  Exercises: LBD, sit<>stand, sittin/standing balance/tolerance   Shoulder Instructions      Home Living Family/patient expects to be discharged to:: Private residence Living Arrangements: Spouse/significant other;Children Available Help at Discharge: Family;Available 24 hours/day Type of Home: House Home Access: Stairs to enter [employer] of Steps: 2 Entrance Stairs-Rails: None Home Layout: One level               Home Equipment: Walker - 2 wheels;Cane - single point          Prior Functioning/Environment Level of Independence: Independent        Comments: reports no use of AD prior to admission although was demonstrating antalgic gait pattern        OT Problem List: Decreased range of motion;Decreased strength;Impaired balance (sitting and/or standing);Decreased activity tolerance      OT Treatment/Interventions: Self-care/ADL training;Therapeutic exercise;Energy conservation;DME and/or AE instruction;Therapeutic activities;Patient/family education;Balance training    OT Goals(Current goals can be found in the care plan section) Acute Rehab OT Goals Patient Stated Goal: to go home OT Goal Formulation: With patient Time For Goal Achievement: 10/08/20 Potential to Achieve Goals: Good ADL Goals Pt Will Perform Lower Body Dressing: sit to/from stand;with modified independence (c LRAD PRN) Pt Will Transfer to Toilet: with modified independence;ambulating;regular height toilet (c LRAD PRN) Additional ADL Goal #1: Pt will Independently verbalize plan to implement x3 pain mgmt strategies  OT Frequency: Min 1X/week   Barriers to D/C:            Co-evaluation              AM-PAC OT "6 Clicks" Daily Activity     Outcome Measure Help from another person eating meals?: None Help from another person taking care of personal grooming?: A Little Help from another person toileting, which includes using toliet, bedpan, or urinal?: A Little Help from  another person bathing (including washing, rinsing, drying)?: A Little Help from another person to put on and taking off regular upper body clothing?: None Help from another person to put on and taking off regular lower body clothing?: A Little 6 Click Score: 20   End of Session    Activity Tolerance: Patient tolerated treatment well Patient left: with call bell/phone within reach;in chair  OT Visit Diagnosis: Muscle weakness (generalized) (M62.81)                Time: 3267-1245 OT Time Calculation (min): 15 min Charges:  OT General Charges $OT Visit: 1 Visit OT Evaluation $OT Eval Low Complexity: 1 Low OT Treatments $Self Care/Home Management : 8-22 mins  Dessie Coma, M.S. OTR/L  09/24/20, 1:17 PM  ascom [phone]

## 2020-09-25 DIAGNOSIS — M1711 Unilateral primary osteoarthritis, right knee: Secondary | ICD-10-CM | POA: Diagnosis not present

## 2020-09-25 MED ORDER — HYDRALAZINE HCL 10 MG PO TABS: 10.0000 mg | ORAL_TABLET | Freq: Two times a day (BID) | ORAL | 0 refills | Status: DC | PRN

## 2020-09-25 MED ORDER — HYDRALAZINE HCL 10 MG PO TABS
10.0000 mg | ORAL_TABLET | Freq: Two times a day (BID) | ORAL | Status: DC | PRN
  Filled 2020-09-25: qty 1

## 2020-09-25 MED ORDER — CLONIDINE HCL 0.1 MG PO TABS
0.2000 mg | ORAL_TABLET | ORAL | Status: AC
  Administered 2020-09-25: 0.2 mg via ORAL
  Filled 2020-09-25: qty 2

## 2020-09-25 NOTE — Progress Notes (Signed)
Physical Therapy Treatment Patient Details Name: Kendra Kim MRN: [id number] DOB: October 14, 1970 Today's Date: 09/25/2020   History of Present Illness Pt admitted for R TKR and is POD 1 at time of evaluation.    PT Comments    Pt seen for PT tx with husband present for session. Pt continues to require min assist for bed mobility, supervision with cuing for safety for transfers, supervision for gait to & from gym, and min assist for stair negotiation backwards with RW. Pt's husband participated in stair negotiation & both pt & spouse report comfort with task with neither voicing questions/concerns re: d/c home today.     Recommendations for follow up therapy are one component of a multi-disciplinary discharge planning process, led by the attending physician.  Recommendations may be updated based on patient status, additional functional criteria and insurance authorization.  Follow Up Recommendations  Home health PT;Supervision for mobility/OOB     Equipment Recommendations  3in1 (PT)    Recommendations for Other Services       Precautions / Restrictions Precautions Precautions: Fall;Knee Restrictions Weight Bearing Restrictions: Yes RLE Weight Bearing: Weight bearing as tolerated     Mobility  Bed Mobility Overal bed mobility: Needs Assistance Bed Mobility: Supine to Sit     Supine to sit: Min assist;HOB elevated     General bed mobility comments: heavy reliance on bed rails with BUE, HOB elevated, requires min assist to transfer RLE to EOB    Transfers Overall transfer level: Needs assistance Equipment used: Rolling walker (2 wheeled) Transfers: Sit to/from Stand Sit to Stand: Supervision         General transfer comment: ongoing cuing for safe hand placement (to push to standing), pt also with difficulty 2/2 IV placement in L hand  Ambulation/Gait Ambulation/Gait assistance: Supervision Gait Distance (Feet): 80 Feet (+ 80 ft) Assistive device: Rolling  walker (2 wheeled) Gait Pattern/deviations: Decreased dorsiflexion - right;Decreased stride length;Decreased stance time - right;Decreased weight shift to right;Decreased step length - right (step to progressing to step-through) Gait velocity: decreased   General Gait Details: decreased R knee & hip flexion during swing phase, decreased heel strike RLE   Stairs Stairs: Yes Stairs assistance: Min assist Stair Management: No rails;Backwards;With walker Number of Stairs: 3 General stair comments: PT provides demo & education to pt's husband then pt's husband assists pt with RW management during stair negotiation with min cuing from PT.   Wheelchair Mobility    Modified Rankin (Stroke Patients Only)       Balance Overall balance assessment: Needs assistance Sitting-balance support: Feet supported;No upper extremity supported Sitting balance-Leahy Scale: Good     Standing balance support: Bilateral upper extremity supported;During functional activity Standing balance-Leahy Scale: Good Standing balance comment: BUE support on RW                            Cognition Arousal/Alertness: Awake/alert Behavior During Therapy: WFL for tasks assessed/performed Overall Cognitive Status: Within Functional Limits for tasks assessed                                 General Comments: Requires extra time to recall compensatory pattern for stair negotiation.      Exercises      General Comments        Pertinent Vitals/Pain Pain Assessment: Faces Faces Pain Scale: Hurts even more Pain Location: R knee Pain Descriptors /  Indicators: Discomfort Pain Intervention(s): Limited activity within patient's tolerance;Monitored during session    Home Living                      Prior Function            PT Goals (current goals can now be found in the care plan section) Acute Rehab PT Goals Patient Stated Goal: to go home PT Goal Formulation: With  patient Time For Goal Achievement: 10/08/20 Potential to Achieve Goals: Good Additional Goals Additional Goal #1: Pt will be able to perform bed mobility/transfers with supervision and safe technique in order to improve functional independence Progress towards PT goals: Progressing toward goals    Frequency    BID      PT Plan Current plan remains appropriate    Co-evaluation              AM-PAC PT "6 Clicks" Mobility   Outcome Measure  Help needed turning from your back to your side while in a flat bed without using bedrails?: A Little Help needed moving from lying on your back to sitting on the side of a flat bed without using bedrails?: A Little Help needed moving to and from a bed to a chair (including a wheelchair)?: A Little Help needed standing up from a chair using your arms (e.g., wheelchair or bedside chair)?: A Little Help needed to walk in hospital room?: A Little Help needed climbing 3-5 steps with a railing? : A Little 6 Click Score: 18    End of Session Equipment Utilized During Treatment: Gait belt Activity Tolerance: Patient tolerated treatment well;Patient limited by pain Patient left: in chair;with call bell/phone within reach;with family/visitor present (wound vac intact, R knee polar care donned) Nurse Communication:  (pt reports having a BM) PT Visit Diagnosis: Muscle weakness (generalized) (M62.81);Difficulty in walking, not elsewhere classified (R26.2);Pain Pain - Right/Left: Right Pain - part of body: Knee     Time: 5885-0277 PT Time Calculation (min) (ACUTE ONLY): 23 min  Charges:  $Gait Training: 8-22 mins $Therapeutic Activity: 8-22 mins                     Lavone Nian, PT, DPT 09/25/20, 1:30 PM    Waunita Schooner 09/25/2020, 1:29 PM

## 2020-09-25 NOTE — Progress Notes (Signed)
Subjective: 2 Days Post-Op Procedure(s) (LRB): TOTAL KNEE ARTHROPLASTY (Right) Patient reports pain as mild.   Patient is well but does have hypertension this AM.  One time dose of clonidone effective yesterday. Denies any CP, SOB, ABD pain. We will continue therapy today.  Plan is to go Home after hospital stay.  Objective: Vital signs in last 24 hours: Temp:  [97.5 F (36.4 C)-99 F (37.2 C)] 98.8 F (37.1 C) (09/24 0819) Pulse Rate:  [87-99] 92 (09/24 0819) Resp:  [16-20] 16 (09/24 0819) BP: (137-175)/(76-89) 175/85 (09/24 0819) SpO2:  [97 %-100 %] 97 % (09/24 0819)  Intake/Output from previous day: 09/23 0701 - 09/24 0700 In: 189.6 [I.V.:189.6] Out: -  Intake/Output this shift: No intake/output data recorded.  Recent Labs    09/23/20 0932 09/24/20 0454  HGB 13.5 13.0   Recent Labs    09/23/20 0932 09/24/20 0454  WBC 6.2 10.9*  RBC 4.54 4.20  HCT 41.4 37.6  PLT 320 282   Recent Labs    09/23/20 0932 09/24/20 0454  NA  --  133*  K  --  3.8  CL  --  101  CO2  --  23  BUN  --  8  CREATININE 0.68 0.60  GLUCOSE  --  166*  CALCIUM  --  8.4*   No results for input(s): LABPT, INR in the last 72 hours.  EXAM General - Patient is Alert, Appropriate, and Oriented Extremity - Neurovascular intact Sensation intact distally Intact pulses distally Dorsiflexion/Plantar flexion intact Incision: dressing C/D/I and no drainage No cellulitis present Compartment soft Dressing - dressing C/D/I and no drainage, Prevena intact without drainage Motor Function - intact, moving foot and toes well on exam.   History reviewed. No pertinent past medical history.  Assessment/Plan:   2 Days Post-Op Procedure(s) (LRB): TOTAL KNEE ARTHROPLASTY (Right) Active Problems:   S/P TKR (total knee replacement) using cement, right  Estimated body mass index is 42.82 kg/m as calculated from the following:   Height as of this encounter: 5\' 2"  (1.575 m).   Weight as of this  encounter: 106.2 kg. Advance diet Up with therapy  Pain controlled Labs stable Patient BP 175/85, no shortness of breath or chest pain.  One time dose of Clonidine ordered. PRN Hydralazine ordered for the patient as well.  Will discharge home with prescription, monitor BP at home and if systolic over 240 take medication.  Follow-up with PCP. Work on bowel movement, patient is passing gas without pain. Plan for discharge home this afternoon.  DVT Prophylaxis - Lovenox, Foot Pumps, and TED hose Weight-Bearing as tolerated to right leg  J. Cameron Proud, PA-C [place] 09/25/2020, 9:19 AM

## 2020-09-25 NOTE — Progress Notes (Signed)
Physical Therapy Treatment Patient Details Name: Kendra Kim MRN: [id number] DOB: 1970-07-15 Today's Date: 09/25/2020   History of Present Illness Pt admitted for R TKR and is POD 1 at time of evaluation.    PT Comments    Pt seen for PT tx with pt agreeable. Pt endorses R knee pain but is premedicated before session & rest breaks provided PRN. Pt requires min assist for bed mobility with heavy reliance on hospital bed features & requires min assist to maneuver RLE. Pt continues to require min cuing for safety/hand placement during transfers and CGA fade to supervision for all mobility. Pt is able to ambulate to gym & back & negotiate stairs with min assist. Pt does demonstrate decreased eccentric control when descending last 2 steps & would benefit from additional stair practice during PM session prior to d/c. Discussed showering at home with pt. Anticipate pt can d/c after PM session.     Recommendations for follow up therapy are one component of a multi-disciplinary discharge planning process, led by the attending physician.  Recommendations may be updated based on patient status, additional functional criteria and insurance authorization.  Follow Up Recommendations  Home health PT;Supervision for mobility/OOB     Equipment Recommendations  3in1 (PT)    Recommendations for Other Services       Precautions / Restrictions Precautions Precautions: Fall;Knee Restrictions Weight Bearing Restrictions: Yes RLE Weight Bearing: Weight bearing as tolerated     Mobility  Bed Mobility Overal bed mobility: Needs Assistance Bed Mobility: Supine to Sit     Supine to sit: Min assist;HOB elevated     General bed mobility comments: heavy reliance on bed rails, HOB elevated, unable to maneuver RLE to EOB even with LLE assisting so PT ultimately provides min assist    Transfers Overall transfer level: Needs assistance Equipment used: Rolling walker (2 wheeled) Transfers: Sit  to/from Stand Sit to Stand: Min guard         General transfer comment: ongoing cuing for hand placement to push to standing; pt with difficulty 2/2 IV in L hand  Ambulation/Gait Ambulation/Gait assistance: Min guard;Supervision Gait Distance (Feet): 80 Feet (+ 80 ft) Assistive device: Rolling walker (2 wheeled) Gait Pattern/deviations: Step-through pattern;Decreased step length - right;Decreased dorsiflexion - right;Decreased stride length;Decreased stance time - right;Decreased weight shift to right;Antalgic Gait velocity: decreased   General Gait Details: decreased R knee & hip flexion during swing phase, decreased heel strike RLE   Stairs Stairs: Yes Stairs assistance: Min assist Stair Management: No rails;Backwards;With walker Number of Stairs: 4 General stair comments: PT initially provides education & demo with pt then negotiating 4 stairs backwards with min cuing & RW. Pt with poor eccentric control with descending last 2 steps.   Wheelchair Mobility    Modified Rankin (Stroke Patients Only)       Balance Overall balance assessment: Needs assistance Sitting-balance support: Feet supported;No upper extremity supported Sitting balance-Leahy Scale: Good     Standing balance support: Bilateral upper extremity supported;During functional activity Standing balance-Leahy Scale: Good Standing balance comment: BUE support on RW                            Cognition Arousal/Alertness: Awake/alert Behavior During Therapy: WFL for tasks assessed/performed Overall Cognitive Status: Within Functional Limits for tasks assessed  Exercises Total Joint Exercises Quad Sets: AROM;Right;5 reps;Supine Heel Slides: AAROM;Right;5 reps;Seated Goniometric ROM: 10 degrees from full extension in supine, 67 degrees flexion    General Comments        Pertinent Vitals/Pain Pain Assessment: Faces Faces Pain Scale:  Hurts whole lot Pain Location: R knee Pain Descriptors / Indicators: Operative site guarding;Grimacing;Discomfort Pain Intervention(s): Limited activity within patient's tolerance;Monitored during session;Premedicated before session    Home Living                      Prior Function            PT Goals (current goals can now be found in the care plan section) Acute Rehab PT Goals Patient Stated Goal: to go home PT Goal Formulation: With patient Time For Goal Achievement: 10/08/20 Potential to Achieve Goals: Good Additional Goals Additional Goal #1: Pt will be able to perform bed mobility/transfers with supervision and safe technique in order to improve functional independence Progress towards PT goals: Progressing toward goals    Frequency    BID      PT Plan Current plan remains appropriate    Co-evaluation              AM-PAC PT "6 Clicks" Mobility   Outcome Measure  Help needed turning from your back to your side while in a flat bed without using bedrails?: A Little Help needed moving from lying on your back to sitting on the side of a flat bed without using bedrails?: A Little Help needed moving to and from a bed to a chair (including a wheelchair)?: A Little Help needed standing up from a chair using your arms (e.g., wheelchair or bedside chair)?: A Little Help needed to walk in hospital room?: A Little Help needed climbing 3-5 steps with a railing? : A Little 6 Click Score: 18    End of Session Equipment Utilized During Treatment: Gait belt Activity Tolerance: Patient tolerated treatment well;Patient limited by pain Patient left: in chair;with chair alarm set;with call bell/phone within reach (polar care donned on RLE, wound vac intact)   PT Visit Diagnosis: Muscle weakness (generalized) (M62.81);Difficulty in walking, not elsewhere classified (R26.2);Pain Pain - Right/Left: Right Pain - part of body: Knee     Time: 2706-2376 PT Time  Calculation (min) (ACUTE ONLY): 46 min  Charges:  $Gait Training: 8-22 mins $Therapeutic Activity: 23-37 mins                     Lavone Nian, PT, DPT 09/25/20, 9:34 AM    Waunita Schooner 09/25/2020, 9:31 AM

## 2020-09-25 NOTE — TOC Transition Note (Signed)
Transition of Care [facility]) - CM/SW Discharge Note   Patient Details  Name: Kendra Kim MRN: [id number] Date of Birth: October 18, 1970  [facility] CM/SW Contact:  Harriet Masson, RN Phone Number:[phone] 09/25/2020, 11:51 AM   Clinical Narrative:    Pt to be discharged today. RN spoke with the pt and verified sufficient transportation will be provider by her spouse Kendra Kim) who will also be provider transportation to all medical appointments and able to obtain her medication upon discharge today. Pt lives in a single family home with just her and her spouse. Pt verified her 3-1 commode has been delivered to the room for transport home.   No other needs at this time and TOC will continue to be available for any additional needs.  Final next level of care: [place] Barriers to Discharge: Continued Medical Work up   Patient Goals and CMS Choice        Discharge Placement                       Discharge Plan and Services   Discharge Planning Services: CM Consult            DME Arranged: 3-N-1 DME Agency: [employer] Date DME Agency Contacted: 09/24/20 Time DME Agency Contacted: 1600 Representative spoke with at DME Agency: [place]: PT [place]: [place] Date [place]: 09/24/20 Time [place]: [place] Representative spoke with at [place]: [place]  Social Determinants of Health ([place]) Interventions     Readmission Risk Interventions No flowsheet data found.

## 2020-09-28 ENCOUNTER — Encounter: Payer: Self-pay | Admitting: Family Medicine

## 2020-10-06 ENCOUNTER — Ambulatory Visit: Payer: Self-pay | Admitting: *Deleted

## 2020-10-06 NOTE — Telephone Encounter (Signed)
Patient is calling to report she is still having elevation in her BP- Patient states she is not having symptoms at this time. Patient has been schedule for virtual visit.

## 2020-10-06 NOTE — Telephone Encounter (Signed)
Reason for Disposition  Systolic BP  >= 291 OR Diastolic >= 916  Answer Assessment - Initial Assessment Questions 1. BLOOD PRESSURE: "What is the blood pressure?" "Did you take at least two measurements 5 minutes apart?"     167/102, 163/97  ( 153/86-earlier this morning) 2. ONSET: "When did you take your blood pressure?"     10 minutes ago 3. HOW: "How did you obtain the blood pressure?" (e.g., visiting nurse, automatic home BP monitor)     PT- manual 4. HISTORY: "Do you have a history of high blood pressure?"     no 5. MEDICATIONS: "Are you taking any medications for blood pressure?" "Have you missed any doses recently?"     Yes- hydrazine bid  6. OTHER SYMPTOMS: "Do you have any symptoms?" (e.g., headache, chest pain, blurred vision, difficulty breathing, weakness)     asymptomatic 7. PREGNANCY: "Is there any chance you are pregnant?" "When was your last menstrual period?"     N/a  Protocols used: Blood Pressure - High-A-AH

## 2020-10-08 ENCOUNTER — Encounter: Payer: Self-pay | Admitting: Family Medicine

## 2020-10-08 ENCOUNTER — Telehealth (INDEPENDENT_AMBULATORY_CARE_PROVIDER_SITE_OTHER): Payer: 59 | Admitting: Family Medicine

## 2020-10-08 VITALS — BP 165/96 | HR 99 | Wt 229.0 lb

## 2020-10-08 DIAGNOSIS — I10 Essential (primary) hypertension: Secondary | ICD-10-CM | POA: Diagnosis not present

## 2020-10-08 MED ORDER — LISINOPRIL 10 MG PO TABS: 10.0000 mg | ORAL_TABLET | Freq: Every day | ORAL | 0 refills | Status: DC

## 2020-10-08 NOTE — Assessment & Plan Note (Signed)
Start of ACEi Continue taking Bps at home Denies CP, SOB, DOE Some LE edema; however, pt s/p TKR Pain in leg as well  Was given hydralazine inpatient

## 2020-10-08 NOTE — Progress Notes (Addendum)
MyChart Video Visit    Virtual Visit via Video Note   This visit type was conducted due to national recommendations for restrictions regarding the COVID-19 Pandemic (e.g. social distancing) in an effort to limit this patient's exposure and mitigate transmission in our community. This patient is at least at moderate risk for complications without adequate follow up. This format is felt to be most appropriate for this patient at this time. Physical exam was limited by quality of the video and audio technology used for the visit.   Patient location: home Provider location: [facility]  I discussed the limitations of evaluation and management by telemedicine and the availability of in person appointments. The patient expressed understanding and agreed to proceed.  Patient: Kendra Kim   DOB: 1970/06/25   50 y.o. Female  MRN: [id number] Visit Date: 10/08/2020  Today's healthcare provider: Jacky Kindle, FNP   Chief Complaint  Patient presents with   Hypertension    Patient reports that she was given Hydralazine while she was in hospital for knee replacement and states that she has been taking medication PRN.    Subjective    HPI HPI     Hypertension   This is a new problem (Patient states since total knee replacement ).  Recent episode started 1 to 4 weeks ago.  The problem is uncontrolled.  Agents associated with hypertension include no associated agents.  Anxiety: Absent.  Blurred vision: Absent.  Chest pain: Absent.  Chest pressure/discomfort: Absent.  Dyspnea: Absent.  Headaches: Absent.  Lower extremity edema: Present.  Orthopnea: Absent.  Palpitations: Absent.  Paroxysmal nocturnal dyspnea: Absent.  Syncope: Absent.  The patient exercises intermittently.  Patient's diet is generally healthy. Additional comments: Patient reports that she was given Hydralazine while she was in hospital for knee replacement and states that she has been taking medication PRN.       Last edited by  Fonda Kinder, CMA on 10/08/2020  2:42 PM.      Pertinent labs: Lab Results  Component Value Date   CHOL 176 05/03/2020   HDL 57 05/03/2020   LDLCALC 97 05/03/2020   TRIG 125 05/03/2020   CHOLHDL 3.2 04/29/2019   Lab Results  Component Value Date   NA 133 (L) 09/24/2020   K 3.8 09/24/2020   CREATININE 0.60 09/24/2020   EGFR 86 05/03/2020   GFRNONAA >60 09/24/2020   GLUCOSE 166 (H) 09/24/2020     The 10-year ASCVD risk score (Arnett DK, et al., 2019) is: 2.2%   ---------------------------------------------------------------------------------------------------    Medications: Outpatient Medications Prior to Visit  Medication Sig   BIOTIN PO Take 1 tablet by mouth daily.   calcium carbonate (TUMS - DOSED IN MG ELEMENTAL CALCIUM) 500 MG chewable tablet Chew 1 tablet by mouth daily as needed for indigestion or heartburn.   docusate sodium (COLACE) 100 MG capsule Take 1 capsule (100 mg total) by mouth 2 (two) times daily.   enoxaparin (LOVENOX) 40 MG/0.4ML injection Inject 0.4 mLs (40 mg total) into the skin daily.   hydrALAZINE (APRESOLINE) 10 MG tablet Take 1 tablet (10 mg total) by mouth 2 (two) times daily as needed (Systolic BP above 150).   HYDROcodone-acetaminophen (NORCO) 7.5-325 MG tablet Take 1-2 tablets by mouth every 4 (four) hours as needed for severe pain (pain score 7-10).   methocarbamol (ROBAXIN) 500 MG tablet Take 1 tablet (500 mg total) by mouth every 6 (six) hours as needed for muscle spasms.   Multiple Vitamin (MULTIVITAMIN) capsule  Take 1 capsule daily by mouth.   polyethylene glycol (MIRALAX / GLYCOLAX) 17 g packet Take 17 g by mouth daily as needed for mild constipation.   Probiotic Product (PROBIOTIC DAILY PO) Take 1 capsule by mouth daily.   traMADol (ULTRAM) 50 MG tablet Take 1 tablet (50 mg total) by mouth every 6 (six) hours as needed.   No facility-administered medications prior to visit.    Review of Systems    Objective    BP (!) 165/96    Pulse 99   Wt 229 lb (103.9 kg)   BMI 41.88 kg/m    Physical Exam Vitals reviewed.  Constitutional:      Appearance: Normal appearance. She is obese.  HENT:     Head: Normocephalic and atraumatic.  Cardiovascular:     Rate and Rhythm: Normal rate.  Pulmonary:     Effort: Pulmonary effort is normal.  Neurological:     General: No focal deficit present.     Mental Status: She is alert and oriented to person, place, and time.  Psychiatric:        Mood and Affect: Mood normal.       Assessment & Plan     Problem List Items Addressed This Visit       Cardiovascular and Mediastinum   Primary hypertension - Primary   Relevant Medications   lisinopril (ZESTRIL) 10 MG tablet     Return in about 6 weeks (around 11/19/2020) for HTN management, blood pressure check.     I discussed the assessment and treatment plan with the patient. The patient was provided an opportunity to ask questions and all were answered. The patient agreed with the plan and demonstrated an understanding of the instructions.   The patient was advised to call back or seek an in-person evaluation if the symptoms worsen or if the condition fails to improve as anticipated.  I provided 12 minutes of non-face-to-face time during this encounter.  Vonna Kotyk, FNP, have reviewed all documentation for this visit. The documentation on 10/08/20 for the exam, diagnosis, procedures, and orders are all accurate and complete.   Gwyneth Sprout, [place] [phone] (phone) [phone] (fax)  [place]

## 2021-01-03 ENCOUNTER — Other Ambulatory Visit: Payer: Self-pay | Admitting: Family Medicine

## 2021-01-03 DIAGNOSIS — I10 Essential (primary) hypertension: Secondary | ICD-10-CM

## 2021-05-05 ENCOUNTER — Encounter: Payer: Self-pay | Admitting: Family Medicine

## 2021-06-13 ENCOUNTER — Encounter: Payer: Self-pay | Admitting: Family Medicine

## 2021-06-13 ENCOUNTER — Ambulatory Visit (INDEPENDENT_AMBULATORY_CARE_PROVIDER_SITE_OTHER): Payer: 59 | Admitting: Family Medicine

## 2021-06-13 VITALS — BP 145/84 | HR 85 | Temp 98.3°F | Resp 16 | Wt 247.3 lb

## 2021-06-13 DIAGNOSIS — Z1231 Encounter for screening mammogram for malignant neoplasm of breast: Secondary | ICD-10-CM

## 2021-06-13 DIAGNOSIS — I10 Essential (primary) hypertension: Secondary | ICD-10-CM | POA: Diagnosis not present

## 2021-06-13 DIAGNOSIS — Z0000 Encounter for general adult medical examination without abnormal findings: Secondary | ICD-10-CM | POA: Diagnosis not present

## 2021-06-13 DIAGNOSIS — R7303 Prediabetes: Secondary | ICD-10-CM

## 2021-06-13 MED ORDER — LISINOPRIL 20 MG PO TABS: 20.0000 mg | ORAL_TABLET | Freq: Every day | ORAL | 1 refills | Status: DC

## 2021-06-13 NOTE — Assessment & Plan Note (Signed)
Recommend low carb diet °Recheck A1c  °

## 2021-06-13 NOTE — Assessment & Plan Note (Signed)
Chronic and uncontrolled Not currently on any medications Will start lisinopril 20 mg daily Recheck metabolic panel Advised 1 m f/u, but she will wait for our next visit

## 2021-06-13 NOTE — Progress Notes (Signed)
I,Kendra Kim,acting as a Education administrator for Kendra Paganini, MD.,have documented all relevant documentation on the behalf of Kendra Paganini, MD,as directed by  Kendra Paganini, MD while in the presence of Kendra Paganini, MD.   Complete physical exam   Patient: Kendra Kim   DOB: August 08, 1970   51 y.o. Female  MRN: [id number] Visit Date: 06/13/2021  Today's healthcare provider: Lavon Paganini, MD   Chief Complaint  Patient presents with   Annual Exam   Subjective    Kendra Kim is a 51 y.o. female who presents today for a complete physical exam.  She reports consuming a general diet.  walking  She generally feels well. She reports sleeping well. She does not have additional problems to discuss today.  HPI   Mom passed away in 07-Feb-2022 and it is stressful dealing with all of the aftermath.  History reviewed. No pertinent past medical history. Past Surgical History:  Procedure Laterality Date   COLONOSCOPY WITH PROPOFOL N/A 06/12/2019   Procedure: COLONOSCOPY WITH PROPOFOL;  Surgeon: Lin Landsman, MD;  Location: [facility] ENDOSCOPY;  Service: Endoscopy;  Laterality: N/A;   FOOT SURGERY Right    x 2, realigned 2nd toe and then fused, by podiatrist (Dr Paulla Dolly)   [place] Right 09/23/2020   Procedure: TOTAL KNEE ARTHROPLASTY;  Surgeon: Hessie Knows, MD;  Location: [facility] ORS;  Service: Orthopedics;  Laterality: Right;   TUBAL LIGATION  2005   Social History   Socioeconomic History   Marital status: Married    Spouse name: Kendra Kim   Number of children: 3   Years of education: 14   Highest education level: Associate degree: academic program  Occupational History    Employer: [employer]  Tobacco Use   Smoking status: Never   Smokeless tobacco: Never  Vaping Use   Vaping Use: Never used  Substance and Sexual Activity   Alcohol use: Yes    Comment: social   Drug use: Never   Sexual activity: Yes    Partners: Male    Birth control/protection:  Surgical  Other Topics Concern   Not on file  Social History Narrative   Not on file   Social Determinants of Health   Financial Resource Strain: Low Risk  (03/22/2017)   Overall Financial Resource Strain (CARDIA)    Difficulty of Paying Living Expenses: Not hard at all  Food Insecurity: No Food Insecurity (11/08/2016)   Hunger Vital Sign    Worried About Running Out of Food in the Last Year: Never true    Ran Out of Food in the Last Year: Never true  Transportation Needs: No Transportation Needs (11/08/2016)   PRAPARE - Hydrologist (Medical): No    Lack of Transportation (Non-Medical): No  Physical Activity: Insufficiently Active (03/22/2017)   Exercise Vital Sign    Days of Exercise per Week: 2 days    Minutes of Exercise per Session: 40 min  Stress: Not on file  Social Connections: Not on file  Intimate Partner Violence: Not on file   Family Status  Relation Name Status   Mother  Alive   Father  Alive   Sister  Alive   MGM  (Not Specified)   PGM  (Not Specified)   PGF  (Not Specified)   Family History  Problem Relation Age of Onset   Epilepsy Mother    Osteoporosis Mother    Colon cancer Mother 3   Macular degeneration Mother  Deep vein thrombosis Mother        after ortho injury   Cancer Mother        Multiple Myeloma   Hypertension Father    Glaucoma Father    Bipolar disorder Sister    Lymphoma Sister    Breast cancer Maternal Grandmother 31   Diabetes Paternal Grandmother    Diabetes Paternal Grandfather    No Known Allergies  Patient Care Team: Emmet Messer, Marzella Schlein, MD as PCP - General (Family Medicine)   Medications: Outpatient Medications Prior to Visit  Medication Sig   BIOTIN PO Take 1 tablet by mouth daily.   Multiple Vitamin (MULTIVITAMIN) capsule Take 1 capsule daily by mouth.   Probiotic Product (PROBIOTIC DAILY PO) Take 1 capsule by mouth daily.   [DISCONTINUED] calcium carbonate (TUMS - DOSED IN MG ELEMENTAL  CALCIUM) 500 MG chewable tablet Chew 1 tablet by mouth daily as needed for indigestion or heartburn.   [DISCONTINUED] docusate sodium (COLACE) 100 MG capsule Take 1 capsule (100 mg total) by mouth 2 (two) times daily.   [DISCONTINUED] enoxaparin (LOVENOX) 40 MG/0.4ML injection Inject 0.4 mLs (40 mg total) into the skin daily.   [DISCONTINUED] hydrALAZINE (APRESOLINE) 10 MG tablet Take 1 tablet (10 mg total) by mouth 2 (two) times daily as needed (Systolic BP above 150).   [DISCONTINUED] HYDROcodone-acetaminophen (NORCO) 7.5-325 MG tablet Take 1-2 tablets by mouth every 4 (four) hours as needed for severe pain (pain score 7-10).   [DISCONTINUED] lisinopril (ZESTRIL) 10 MG tablet TAKE 1 TABLET(10 MG) BY MOUTH DAILY   [DISCONTINUED] methocarbamol (ROBAXIN) 500 MG tablet Take 1 tablet (500 mg total) by mouth every 6 (six) hours as needed for muscle spasms.   [DISCONTINUED] polyethylene glycol (MIRALAX / GLYCOLAX) 17 g packet Take 17 g by mouth daily as needed for mild constipation.   [DISCONTINUED] traMADol (ULTRAM) 50 MG tablet Take 1 tablet (50 mg total) by mouth every 6 (six) hours as needed.   No facility-administered medications prior to visit.    Review of Systems  All other systems reviewed and are negative.   Last CBC Lab Results  Component Value Date   WBC 10.9 (H) 09/24/2020   HGB 13.0 09/24/2020   HCT 37.6 09/24/2020   MCV 89.5 09/24/2020   MCH 31.0 09/24/2020   RDW 13.3 09/24/2020   PLT 282 09/24/2020   Last metabolic panel Lab Results  Component Value Date   GLUCOSE 166 (H) 09/24/2020   NA 133 (L) 09/24/2020   K 3.8 09/24/2020   CL 101 09/24/2020   CO2 23 09/24/2020   BUN 8 09/24/2020   CREATININE 0.60 09/24/2020   GFRNONAA >60 09/24/2020   CALCIUM 8.4 (L) 09/24/2020   PROT 7.0 09/13/2020   ALBUMIN 4.1 09/13/2020   LABGLOB 2.9 05/03/2020   AGRATIO 1.6 05/03/2020   BILITOT 0.3 09/13/2020   ALKPHOS 125 09/13/2020   AST 22 09/13/2020   ALT 30 09/13/2020    ANIONGAP 9 09/24/2020   Last lipids Lab Results  Component Value Date   CHOL 176 05/03/2020   HDL 57 05/03/2020   LDLCALC 97 05/03/2020   TRIG 125 05/03/2020   CHOLHDL 3.2 04/29/2019   Last hemoglobin A1c Lab Results  Component Value Date   HGBA1C 5.8 (H) 05/03/2020   Last thyroid functions Lab Results  Component Value Date   TSH 1.440 05/03/2020      Objective     BP (!) 145/84 (BP Location: Left Arm, Patient Position: Sitting, Cuff Size: Large)  Pulse 85   Temp 98.3 F (36.8 C) (Oral)   Resp 16   Wt 247 lb 4.8 oz (112.2 kg)   BMI 45.23 kg/m  BP Readings from Last 3 Encounters:  06/13/21 (!) 145/84  10/08/20 (!) 165/96  09/25/20 (!) 175/85   Wt Readings from Last 3 Encounters:  06/13/21 247 lb 4.8 oz (112.2 kg)  10/08/20 229 lb (103.9 kg)  09/23/20 234 lb 2.1 oz (106.2 kg)      Physical Exam Vitals reviewed.  Constitutional:      General: She is not in acute distress.    Appearance: Normal appearance. She is well-developed. She is not diaphoretic.  HENT:     Head: Normocephalic and atraumatic.     Right Ear: Tympanic membrane, ear canal and external ear normal.     Left Ear: Tympanic membrane, ear canal and external ear normal.     Nose: Nose normal.     Mouth/Throat:     Mouth: Mucous membranes are moist.     Pharynx: Oropharynx is clear. No oropharyngeal exudate.  Eyes:     General: No scleral icterus.    Conjunctiva/sclera: Conjunctivae normal.     Pupils: Pupils are equal, round, and reactive to light.  Neck:     Thyroid: No thyromegaly.  Cardiovascular:     Rate and Rhythm: Normal rate and regular rhythm.     Pulses: Normal pulses.     Heart sounds: Normal heart sounds. No murmur heard. Pulmonary:     Effort: Pulmonary effort is normal. No respiratory distress.     Breath sounds: Normal breath sounds. No wheezing or rales.  Abdominal:     General: There is no distension.     Palpations: Abdomen is soft.     Tenderness: There is no  abdominal tenderness.  Musculoskeletal:        General: No deformity.     Cervical back: Neck supple.     Right lower leg: No edema.     Left lower leg: No edema.  Lymphadenopathy:     Cervical: No cervical adenopathy.  Skin:    General: Skin is warm and dry.     Findings: No rash.  Neurological:     Mental Status: She is alert and oriented to person, place, and time. Mental status is at baseline.     Gait: Gait normal.  Psychiatric:        Mood and Affect: Mood normal.        Behavior: Behavior normal.        Thought Content: Thought content normal.       Last depression screening scores    06/13/2021   10:55 AM 05/03/2020   10:17 AM 10/27/2019    2:48 PM  PHQ 2/9 Scores  PHQ - 2 Score 0 0 0  PHQ- 9 Score 0 0 0   Last fall risk screening    06/13/2021   10:55 AM  Fall Risk   Falls in the past year? 0  Number falls in past yr: 0  Injury with Fall? 0  Risk for fall due to : No Fall Risks  Follow up Falls evaluation completed   Last Audit-C alcohol use screening    06/13/2021   10:56 AM  Alcohol Use Disorder Test (AUDIT)  1. How often do you have a drink containing alcohol? 1  2. How many drinks containing alcohol do you have on a typical day when you are drinking? 0  3. How often do  you have six or more drinks on one occasion? 0  AUDIT-C Score 1   A score of 3 or more in women, and 4 or more in men indicates increased risk for alcohol abuse, EXCEPT if all of the points are from question 1   No results found for any visits on 06/13/21.  Assessment & Plan    Routine Health Maintenance and Physical Exam  Exercise Activities and Dietary recommendations  Goals   None     Immunization History  Administered Date(s) Administered   Moderna Sars-Covid-2 Vaccination 08/21/2019, 09/22/2019   Tdap 11/08/2016    Health Maintenance  Topic Date Due   COVID-19 Vaccine (3 - Moderna series) 11/17/2019   Zoster Vaccines- Shingrix (1 of 2) Never done   INFLUENZA  VACCINE  08/02/2021   PAP SMEAR-Modifier  03/23/2022   MAMMOGRAM  08/04/2022   COLONOSCOPY (Pts 45-93yrs Insurance coverage will need to be confirmed)  06/11/2024   TETANUS/TDAP  11/09/2026   Hepatitis C Screening  Completed   HIV Screening  Completed   HPV VACCINES  Aged Out    Discussed health benefits of physical activity, and encouraged her to engage in regular exercise appropriate for her age and condition.  Problem List Items Addressed This Visit       Cardiovascular and Mediastinum   Primary hypertension    Chronic and uncontrolled Not currently on any medications Will start lisinopril 20 mg daily Recheck metabolic panel Advised 1 m f/u, but she will wait for our next visit      Relevant Medications   lisinopril (ZESTRIL) 20 MG tablet   Other Relevant Orders   Lipid panel   Comprehensive metabolic panel     Other   Morbid obesity ([place])    Discussed importance of healthy weight management Discussed diet and exercise       Relevant Orders   Hemoglobin A1c   Lipid panel   Comprehensive metabolic panel   CBC   Prediabetes    Recommend low carb diet Recheck A1c       Relevant Orders   Hemoglobin A1c   Other Visit Diagnoses     Encounter for annual physical exam    -  Primary   Relevant Orders   Hemoglobin A1c   Lipid panel   Comprehensive metabolic panel   CBC   Encounter for screening mammogram for malignant neoplasm of breast       Relevant Orders   MM 3D SCREEN BREAST BILATERAL        Return in about 4 months (around 10/13/2021) for chronic disease f/u, BP f/u.     I, Kendra Paganini, MD, have reviewed all documentation for this visit. The documentation on 06/13/21 for the exam, diagnosis, procedures, and orders are all accurate and complete.   Ressie Slevin, Dionne Bucy, MD, MPH [facility]

## 2021-06-13 NOTE — Assessment & Plan Note (Signed)
Discussed importance of healthy weight management Discussed diet and exercise  

## 2021-08-05 ENCOUNTER — Ambulatory Visit
Admission: RE | Admit: 2021-08-05 | Discharge: 2021-08-05 | Disposition: A | Discharge status: Home / Self Care | Payer: 59 | Source: Ambulatory Visit | Attending: Family Medicine | Admitting: Family Medicine

## 2021-08-05 DIAGNOSIS — Z1231 Encounter for screening mammogram for malignant neoplasm of breast: Secondary | ICD-10-CM | POA: Insufficient documentation

## 2021-08-06 LAB — HEMOGLOBIN A1C
Est. average glucose Bld gHb Est-mCnc: 117 mg/dL
Hgb A1c MFr Bld: 5.7 % — ABNORMAL HIGH (ref 4.8–5.6)

## 2021-08-06 LAB — CBC
Hematocrit: 41.2 % (ref 34.0–46.6)
Hemoglobin: 13.9 g/dL (ref 11.1–15.9)
MCH: 30 pg (ref 26.6–33.0)
MCHC: 33.7 g/dL (ref 31.5–35.7)
MCV: 89 fL (ref 79–97)
Platelets: 351 10*3/uL (ref 150–450)
RBC: 4.63 x10E6/uL (ref 3.77–5.28)
RDW: 13.3 % (ref 11.7–15.4)
WBC: 6.2 10*3/uL (ref 3.4–10.8)

## 2021-08-06 LAB — COMPREHENSIVE METABOLIC PANEL
ALT: 18 IU/L (ref 0–32)
AST: 15 IU/L (ref 0–40)
Albumin/Globulin Ratio: 1.6 (ref 1.2–2.2)
Albumin: 4.4 g/dL (ref 3.8–4.9)
Alkaline Phosphatase: 136 IU/L — ABNORMAL HIGH (ref 44–121)
BUN/Creatinine Ratio: 14 (ref 9–23)
BUN: 11 mg/dL (ref 6–24)
Bilirubin Total: 0.3 mg/dL (ref 0.0–1.2)
CO2: 21 mmol/L (ref 20–29)
Calcium: 9.8 mg/dL (ref 8.7–10.2)
Chloride: 107 mmol/L — ABNORMAL HIGH (ref 96–106)
Creatinine, Ser: 0.8 mg/dL (ref 0.57–1.00)
Globulin, Total: 2.8 g/dL (ref 1.5–4.5)
Glucose: 105 mg/dL — ABNORMAL HIGH (ref 70–99)
Potassium: 5 mmol/L (ref 3.5–5.2)
Sodium: 142 mmol/L (ref 134–144)
Total Protein: 7.2 g/dL (ref 6.0–8.5)
eGFR: 89 mL/min/{1.73_m2} (ref >59)

## 2021-08-06 LAB — LIPID PANEL
Chol/HDL Ratio: 3.2 ratio (ref 0.0–4.4)
Cholesterol, Total: 181 mg/dL (ref 100–199)
HDL: 57 mg/dL (ref >39)
LDL Chol Calc (NIH): 108 mg/dL — ABNORMAL HIGH (ref 0–99)
Triglycerides: 87 mg/dL (ref 0–149)
VLDL Cholesterol Cal: 16 mg/dL (ref 5–40)

## 2021-08-08 NOTE — Progress Notes (Signed)
Stable pre-diabetes. Continue to recommend balanced, lower carb meals. Smaller meal size, adding snacks. Choosing water as drink of choice and increasing purposeful exercise.  Cholesterol stable. LDL/bad cholesterol has increased. The 10-year ASCVD risk score (Arnett DK, et al., 2019) is: 2%    Values used to calculate the score:     Age: 51 years     Sex: Female     Is Non-Hispanic African American: No     Diabetic: No     Tobacco smoker: No     Systolic Blood Pressure: 177 mmHg     Is BP treated: Yes     HDL Cholesterol: 57 mg/dL     Total Cholesterol: 181 mg/dL  Heart attack and stroke risk is 2% estimated within the next 10 years which is low.   Please let us know if you have any questions.  Thank you, Gwyneth Sprout, [address] [place], [place] [address] [phone] (phone) [phone] (fax) [place]

## 2021-08-08 NOTE — Progress Notes (Signed)
Hi Darlina  Normal mammogram; repeat in 1 year.  Please let us know if you have any questions.  Thank you,  Silas Muff, FNP 

## 2021-08-17 ENCOUNTER — Encounter: Payer: Self-pay | Admitting: Family Medicine

## 2021-10-13 ENCOUNTER — Ambulatory Visit: Payer: 59 | Admitting: Family Medicine

## 2021-10-27 NOTE — Progress Notes (Signed)
I,Sulibeya S Dimas,acting as a Education administrator for Lavon Paganini, MD.,have documented all relevant documentation on the behalf of Lavon Paganini, MD,as directed by  Lavon Paganini, MD while in the presence of Lavon Paganini, MD.   Established patient visit   Patient: Kendra Kim   DOB: Sep 16, 1970   51 y.o. Female  MRN: [id number] Visit Date: 10/31/2021  Today's healthcare provider: Lavon Paganini, MD   Chief Complaint  Patient presents with   Hypertension   Subjective    HPI  Patient refused flu and shingles vaccine today.   Hypertension, follow-up  BP Readings from Last 3 Encounters:  10/31/21 129/82  06/13/21 (!) 145/84  10/08/20 (!) 165/96   Wt Readings from Last 3 Encounters:  10/31/21 253 lb 11.2 oz (115.1 kg)  06/13/21 247 lb 4.8 oz (112.2 kg)  10/08/20 229 lb (103.9 kg)     She was last seen for hypertension 4 months ago.  BP at that visit was 145/84. Management since that visit includes start lisinopril 3m daily.  She reports excellent compliance with treatment. She is not having side effects.  She is following a Regular, Low Sodium diet. She is exercising. She does not smoke.  Use of agents associated with hypertension: none.   Outside blood pressures are not checked. Symptoms: No chest pain No chest pressure  No palpitations No syncope  No dyspnea No orthopnea  No paroxysmal nocturnal dyspnea Yes lower extremity edema   Pertinent labs Lab Results  Component Value Date   CHOL 181 08/05/2021   HDL 57 08/05/2021   LDLCALC 108 (H) 08/05/2021   TRIG 87 08/05/2021   CHOLHDL 3.2 08/05/2021   Lab Results  Component Value Date   NA 142 08/05/2021   K 5.0 08/05/2021   CREATININE 0.80 08/05/2021   EGFR 89 08/05/2021   GLUCOSE 105 (H) 08/05/2021   TSH 1.440 05/03/2020     The 10-year ASCVD risk score (Arnett DK, et al., 2019) is:  1.6%  ---------------------------------------------------------------------------------------------------   Medications: Outpatient Medications Prior to Visit  Medication Sig   BIOTIN PO Take 1 tablet by mouth daily.   Multiple Vitamin (MULTIVITAMIN) capsule Take 1 capsule daily by mouth.   Probiotic Product (PROBIOTIC DAILY PO) Take 1 capsule by mouth daily.   [DISCONTINUED] lisinopril (ZESTRIL) 10 MG tablet TAKE 1 TABLET(10 MG) BY MOUTH DAILY   [DISCONTINUED] lisinopril (ZESTRIL) 20 MG tablet Take 1 tablet (20 mg total) by mouth daily.   No facility-administered medications prior to visit.    Review of Systems  Constitutional:  Negative for appetite change and fatigue.  Eyes:  Negative for visual disturbance.  Respiratory:  Negative for chest tightness and shortness of breath.   Cardiovascular:  Positive for leg swelling. Negative for chest pain and palpitations.  Gastrointestinal:  Negative for nausea and vomiting.  Neurological:  Negative for headaches.       Objective    BP 129/82 (BP Location: Left Arm, Patient Position: Sitting, Cuff Size: Large)   Pulse 87   Temp 98.6 F (37 C) (Oral)   Resp 16   Ht _0  (1.549 m)   Wt 253 lb 11.2 oz (115.1 kg)   BMI 47.94 kg/m  BP Readings from Last 3 Encounters:  10/31/21 129/82  06/13/21 (!) 145/84  10/08/20 (!) 165/96   Wt Readings from Last 3 Encounters:  10/31/21 253 lb 11.2 oz (115.1 kg)  06/13/21 247 lb 4.8 oz (112.2 kg)  10/08/20 229 lb (103.9 kg)  Physical Exam Vitals reviewed.  Constitutional:      General: She is not in acute distress.    Appearance: Normal appearance. She is well-developed. She is not diaphoretic.  HENT:     Head: Normocephalic and atraumatic.  Eyes:     General: No scleral icterus.    Conjunctiva/sclera: Conjunctivae normal.  Neck:     Thyroid: No thyromegaly.  Cardiovascular:     Rate and Rhythm: Normal rate and regular rhythm.     Pulses: Normal pulses.     Heart sounds:  Normal heart sounds. No murmur heard. Pulmonary:     Effort: Pulmonary effort is normal. No respiratory distress.     Breath sounds: Normal breath sounds. No wheezing, rhonchi or rales.  Musculoskeletal:     Cervical back: Neck supple.     Right lower leg: Edema present.     Left lower leg: Edema present.  Lymphadenopathy:     Cervical: No cervical adenopathy.  Skin:    General: Skin is warm and dry.     Findings: No rash.  Neurological:     Mental Status: She is alert and oriented to person, place, and time. Mental status is at baseline.  Psychiatric:        Mood and Affect: Mood normal.        Behavior: Behavior normal.       No results found for any visits on 10/31/21.  Assessment & Plan     Problem List Items Addressed This Visit       Cardiovascular and Mediastinum   Primary hypertension - Primary    Well controlled Continue current medications Reviewed recent metabolic panel F/u in 6 months       Relevant Medications   lisinopril (ZESTRIL) 20 MG tablet     Other   Morbid obesity ([place])    Discussed importance of healthy weight management Discussed diet and exercise       Prediabetes    Reviewed last a1c - reviewed low carb diet        Return in about 8 months (around 07/02/2022) for CPE.      I, Lavon Paganini, MD, have reviewed all documentation for this visit. The documentation on 10/31/21 for the exam, diagnosis, procedures, and orders are all accurate and complete.   Jacklin Zwick, Dionne Bucy, MD, MPH [facility]

## 2021-10-30 ENCOUNTER — Other Ambulatory Visit: Payer: Self-pay | Admitting: Family Medicine

## 2021-10-30 DIAGNOSIS — I10 Essential (primary) hypertension: Secondary | ICD-10-CM

## 2021-10-31 ENCOUNTER — Encounter: Payer: Self-pay | Admitting: Family Medicine

## 2021-10-31 ENCOUNTER — Ambulatory Visit: Payer: 59 | Admitting: Family Medicine

## 2021-10-31 VITALS — BP 129/82 | HR 87 | Temp 98.6°F | Resp 16 | Ht 61.0 in | Wt 253.7 lb

## 2021-10-31 DIAGNOSIS — I10 Essential (primary) hypertension: Secondary | ICD-10-CM | POA: Diagnosis not present

## 2021-10-31 DIAGNOSIS — R7303 Prediabetes: Secondary | ICD-10-CM

## 2021-10-31 MED ORDER — LISINOPRIL 20 MG PO TABS: 20.0000 mg | ORAL_TABLET | Freq: Every day | ORAL | 1 refills | Status: DC

## 2021-10-31 NOTE — Assessment & Plan Note (Signed)
Discussed importance of healthy weight management Discussed diet and exercise  

## 2021-10-31 NOTE — Assessment & Plan Note (Signed)
Reviewed last a1c - reviewed low carb diet

## 2021-10-31 NOTE — Assessment & Plan Note (Signed)
Well controlled Continue current medications Reviewed recent metabolic panel F/u in 6 months  

## 2022-06-19 ENCOUNTER — Encounter: Payer: 59 | Admitting: Family Medicine

## 2022-06-27 ENCOUNTER — Ambulatory Visit (INDEPENDENT_AMBULATORY_CARE_PROVIDER_SITE_OTHER): Payer: 59 | Admitting: Family Medicine

## 2022-06-27 ENCOUNTER — Encounter: Payer: Self-pay | Admitting: Family Medicine

## 2022-06-27 VITALS — BP 148/91 | HR 78 | Temp 98.0°F | Resp 12 | Wt 258.6 lb

## 2022-06-27 DIAGNOSIS — I10 Essential (primary) hypertension: Secondary | ICD-10-CM

## 2022-06-27 DIAGNOSIS — Z0000 Encounter for general adult medical examination without abnormal findings: Secondary | ICD-10-CM

## 2022-06-27 DIAGNOSIS — R7303 Prediabetes: Secondary | ICD-10-CM

## 2022-06-27 DIAGNOSIS — Z1231 Encounter for screening mammogram for malignant neoplasm of breast: Secondary | ICD-10-CM

## 2022-06-27 DIAGNOSIS — Z124 Encounter for screening for malignant neoplasm of cervix: Secondary | ICD-10-CM

## 2022-06-27 MED ORDER — LISINOPRIL 40 MG PO TABS: 40.0000 mg | ORAL_TABLET | Freq: Every day | ORAL | 1 refills | Status: DC

## 2022-06-27 NOTE — Assessment & Plan Note (Signed)
Uncontrolled Increase lisinopril to 40 mg daily F/u in 1 m

## 2022-06-27 NOTE — Assessment & Plan Note (Signed)
Discussed importance of healthy weight management Discussed diet and exercise  

## 2022-06-27 NOTE — Progress Notes (Signed)
I,Kendra Kim,acting as a Neurosurgeon for Kendra Latch, MD.,have documented all relevant documentation on the behalf of Kendra Latch, MD,as directed by  Kendra Latch, MD while in the presence of Kendra Latch, MD.    Complete physical exam   Patient: Kendra Kim   DOB: 1970-10-19   52 y.o. Female  MRN: [id number] Visit Date: 06/27/2022  Today's healthcare provider: Shirlee Latch, MD   Chief Complaint  Patient presents with   Annual Exam   Subjective    Kendra Kim is a 52 y.o. female who presents today for a complete physical exam.  She reports consuming a general diet. Home exercise routine includes walking 1.5 hrs per week. She generally feels well. She reports sleeping well. She does not have additional problems to discuss today.  HPI    Past Medical History:  Diagnosis Date   Arthritis    GERD (gastroesophageal reflux disease)    Hypertension 09/23/2020   Past Surgical History:  Procedure Laterality Date   COLONOSCOPY WITH PROPOFOL N/A 06/12/2019   Procedure: COLONOSCOPY WITH PROPOFOL;  Surgeon: Kendra Reil, MD;  Location: [facility] END[facility]COPY;  Service: Endoscopy;  Laterality: N/A;   FOOT SURGERY Right    x 2, realigned 2nd toe and then fused, by podiatrist (Kendra Kim)   JOINT REPLACEMENT  09/23/2021   Knee replacement   TOTAL KNEE ARTHROPLASTY Right 09/23/2020   Procedure: TOTAL KNEE ARTHROPLASTY;  Surgeon: Kendra Bucker, MD;  Location: [facility] ORS;  Service: Orthopedics;  Laterality: Right;   TUBAL LIGATION  2005   Social History   Socioeconomic History   Marital status: Married    Spouse name: Kendra Kim   Number of children: 3   Years of education: 14   Highest education level: Associate degree: academic program  Occupational History    Employer: [employer]  Tobacco Use   Smoking status: Never   Smokeless tobacco: Never  Vaping Use   Vaping Use: Never used  Substance and Sexual Activity   Alcohol use: Yes    Comment: social  drinker   Drug use: Never   Sexual activity: Yes    Partners: Male    Birth control/protection: Surgical  Other Topics Concern   Not on file  Social History Narrative   Not on file   Social Determinants of Health   Financial Resource Strain: Low Risk  (03/22/2017)   Overall Financial Resource Strain (CARDIA)    Difficulty of Paying Living Expenses: Not hard at all  Food Insecurity: No Food Insecurity (11/08/2016)   Hunger Vital Sign    Worried About Running Out of Food in the Last Year: Never true    Ran Out of Food in the Last Year: Never true  Transportation Needs: No Transportation Needs (11/08/2016)   PRAPARE - Administrator, Civil Service (Medical): No    Lack of Transportation (Non-Medical): No  Physical Activity: Insufficiently Active (03/22/2017)   Exercise Vital Sign    Days of Exercise per Week: 2 days    Minutes of Exercise per Session: 40 min  Stress: Not on file  Social Connections: Not on file  Intimate Partner Violence: Not on file   Family Status  Relation Name Status   Mother Mother Alive   Father Father Alive   Sister  Alive   MGM  (Not Specified)   PGM Grandmother (Not Specified)   PGF Grandfather (Not Specified)   Family History  Problem Relation Age of Onset   Epilepsy Mother  Osteoporosis Mother    Colon cancer Mother 36   Macular degeneration Mother    Deep vein thrombosis Mother        after ortho injury   Cancer Mother        Multiple Myeloma   Hypertension Father    Glaucoma Father    Bipolar disorder Sister    Lymphoma Sister    Breast cancer Maternal Grandmother 40   Diabetes Paternal Grandmother    Diabetes Paternal Grandfather    No Known Allergies  Patient Care Team: Kendra Kim, Kendra Schlein, MD as PCP - General (Family Medicine)   Medications: Outpatient Medications Prior to Visit  Medication Sig   BIOTIN PO Take 1 tablet by mouth daily.   Multiple Vitamin (MULTIVITAMIN) capsule Take 1 capsule daily by mouth.    Probiotic Product (PROBIOTIC DAILY PO) Take 1 capsule by mouth daily.   [DISCONTINUED] lisinopril (ZESTRIL) 20 MG tablet Take 1 tablet (20 mg total) by mouth daily.   No facility-administered medications prior to visit.    Review of Systems  All other systems reviewed and are negative.     Objective    BP (!) 148/91 (BP Location: Left Arm, Patient Position: Sitting, Cuff Size: Large)   Pulse 78   Temp 98 F (36.7 C) (Temporal)   Resp 12   Wt 258 lb 9.6 oz (117.3 kg)   BMI 48.86 kg/m     Physical Exam Vitals reviewed.  Constitutional:      General: She is not in acute distress.    Appearance: Normal appearance. She is well-developed. She is not diaphoretic.  HENT:     Head: Normocephalic and atraumatic.     Right Ear: Tympanic membrane, ear canal and external ear normal.     Left Ear: Tympanic membrane, ear canal and external ear normal.     Nose: Nose normal.     Mouth/Throat:     Mouth: Mucous membranes are moist.     Pharynx: Oropharynx is clear. No oropharyngeal exudate.  Eyes:     General: No scleral icterus.    Conjunctiva/sclera: Conjunctivae normal.     Pupils: Pupils are equal, round, and reactive to light.  Neck:     Thyroid: No thyromegaly.  Cardiovascular:     Rate and Rhythm: Normal rate and regular rhythm.     Pulses: Normal pulses.     Heart sounds: Normal heart sounds. No murmur heard. Pulmonary:     Effort: Pulmonary effort is normal. No respiratory distress.     Breath sounds: Normal breath sounds. No wheezing or rales.  Abdominal:     General: There is no distension.     Palpations: Abdomen is soft.     Tenderness: There is no abdominal tenderness.  Musculoskeletal:        General: No deformity.     Cervical back: Neck supple.     Right lower leg: No edema.     Left lower leg: No edema.  Lymphadenopathy:     Cervical: No cervical adenopathy.  Skin:    General: Skin is warm and dry.     Findings: No rash.  Neurological:     Mental  Status: She is alert and oriented to person, place, and time. Mental status is at baseline.     Gait: Gait normal.  Psychiatric:        Mood and Affect: Mood normal.        Behavior: Behavior normal.        Thought  Content: Thought content normal.       Last depression screening scores    10/31/2021    1:54 PM 06/13/2021   10:55 AM 05/03/2020   10:17 AM  PHQ 2/9 Scores  PHQ - 2 Score 0 0 0  PHQ- 9 Score 0 0 0   Last fall risk screening    10/31/2021    1:54 PM  Fall Risk   Falls in the past year? 0  Number falls in past yr: 0  Injury with Fall? 0  Risk for fall due to : No Fall Risks  Follow up Falls evaluation completed   Last Audit-C alcohol use screening    10/31/2021    1:54 PM  Alcohol Use Disorder Test (AUDIT)  1. How often do you have a drink containing alcohol? 2  2. How many drinks containing alcohol do you have on a typical day when you are drinking? 0  3. How often do you have six or more drinks on one occasion? 0  AUDIT-C Score 2   A score of 3 or more in women, and 4 or more in men indicates increased risk for alcohol abuse, EXCEPT if all of the points are from question 1   No results found for any visits on 06/27/22.  Assessment & Plan    Routine Health Maintenance and Physical Exam  Exercise Activities and Dietary recommendations  Goals   None     Immunization History  Administered Date(s) Administered   Moderna Sars-Covid-2 Vaccination 08/21/2019, 09/22/2019   Tdap 11/08/2016    Health Maintenance  Topic Date Due   Zoster Vaccines- Shingrix (1 of 2) Never done   COVID-19 Vaccine (3 - 2023-24 season) 09/02/2021   PAP SMEAR-Modifier  03/23/2022   INFLUENZA VACCINE  08/03/2022   MAMMOGRAM  08/06/2023   Colonoscopy  06/11/2024   DTaP/Tdap/Td (2 - Td or Tdap) 11/09/2026   Hepatitis C Screening  Completed   HIV Screening  Completed   HPV VACCINES  Aged Out    Discussed health benefits of physical activity, and encouraged her to engage  in regular exercise appropriate for her age and condition.  Problem List Items Addressed This Visit       Cardiovascular and Mediastinum   Primary hypertension    Uncontrolled Increase lisinopril to 40 mg daily F/u in 1 m      Relevant Medications   lisinopril (ZESTRIL) 40 MG tablet   Other Relevant Orders   Comprehensive metabolic panel     Other   Morbid obesity (HCC)    Discussed importance of healthy weight management Discussed diet and exercise       Relevant Orders   Lipid panel   Comprehensive metabolic panel   Prediabetes    Recommend low carb diet Recheck A1c       Relevant Orders   Hemoglobin A1c   Other Visit Diagnoses     Encounter for annual physical exam    -  Primary   Relevant Orders   Lipid panel   Comprehensive metabolic panel   Hemoglobin A1c   Breast cancer screening by mammogram       Relevant Orders   MM 3D SCREENING MAMMOGRAM BILATERAL BREAST   Cervical cancer screening       Relevant Orders   IGP, Aptima HPV           Return in about 4 weeks (around 07/25/2022) for BP f/u.     I, Kendra Latch, MD, have reviewed all documentation  for this visit. The documentation on 06/27/22 for the exam, diagnosis, procedures, and orders are all accurate and complete.   Lavene Penagos, Kendra Schlein, MD, MPH [facility]

## 2022-06-27 NOTE — Assessment & Plan Note (Signed)
Recommend low carb diet °Recheck A1c  °

## 2022-06-28 LAB — COMPREHENSIVE METABOLIC PANEL
ALT: 18 IU/L (ref 0–32)
AST: 18 IU/L (ref 0–40)
Albumin: 4.5 g/dL (ref 3.8–4.9)
Alkaline Phosphatase: 154 IU/L — ABNORMAL HIGH (ref 44–121)
BUN/Creatinine Ratio: 16 (ref 9–23)
BUN: 13 mg/dL (ref 6–24)
Bilirubin Total: 0.3 mg/dL (ref 0.0–1.2)
CO2: 21 mmol/L (ref 20–29)
Calcium: 9.3 mg/dL (ref 8.7–10.2)
Chloride: 103 mmol/L (ref 96–106)
Creatinine, Ser: 0.79 mg/dL (ref 0.57–1.00)
Globulin, Total: 2.9 g/dL (ref 1.5–4.5)
Glucose: 111 mg/dL — ABNORMAL HIGH (ref 70–99)
Potassium: 4.7 mmol/L (ref 3.5–5.2)
Sodium: 141 mmol/L (ref 134–144)
Total Protein: 7.4 g/dL (ref 6.0–8.5)
eGFR: 90 mL/min/{1.73_m2} (ref >59)

## 2022-06-28 LAB — LIPID PANEL
Chol/HDL Ratio: 3.1 ratio (ref 0.0–4.4)
Cholesterol, Total: 197 mg/dL (ref 100–199)
HDL: 64 mg/dL (ref >39)
LDL Chol Calc (NIH): 118 mg/dL — ABNORMAL HIGH (ref 0–99)
Triglycerides: 86 mg/dL (ref 0–149)
VLDL Cholesterol Cal: 15 mg/dL (ref 5–40)

## 2022-06-28 LAB — HEMOGLOBIN A1C
Est. average glucose Bld gHb Est-mCnc: 117 mg/dL
Hgb A1c MFr Bld: 5.7 % — ABNORMAL HIGH (ref 4.8–5.6)

## 2022-06-29 LAB — IGP, APTIMA HPV
HPV Aptima: NEGATIVE
PAP Smear Comment: 0

## 2022-07-07 ENCOUNTER — Telehealth: Payer: Self-pay

## 2022-07-07 DIAGNOSIS — R748 Abnormal levels of other serum enzymes: Secondary | ICD-10-CM

## 2022-07-07 NOTE — Telephone Encounter (Signed)
-----   Message from Erasmo Downer, MD sent at 07/07/2022 12:01 PM EDT ----- Alk phos elevation is balanced (not entirely from liver). Recommend RUQ [place] to evaluate liver further. Please order

## 2022-07-08 LAB — ALKALINE PHOSPHATASE, ISOENZYMES
Alkaline Phosphatase: 154 IU/L — ABNORMAL HIGH (ref 44–121)
BONE FRACTION: 39 % (ref 14–68)
INTESTINAL FRAC.: 0 % (ref 0–18)
LIVER FRACTION: 61 % (ref 18–85)

## 2022-07-08 LAB — SPECIMEN STATUS REPORT

## 2022-07-17 ENCOUNTER — Ambulatory Visit
Admission: RE | Admit: 2022-07-17 | Discharge: 2022-07-17 | Disposition: A | Discharge status: Home / Self Care | Payer: 59 | Source: Ambulatory Visit | Attending: Family Medicine | Admitting: Family Medicine

## 2022-07-17 DIAGNOSIS — R748 Abnormal levels of other serum enzymes: Secondary | ICD-10-CM | POA: Diagnosis not present

## 2022-07-25 ENCOUNTER — Encounter: Payer: Self-pay | Admitting: Family Medicine

## 2022-07-25 ENCOUNTER — Ambulatory Visit: Payer: 59 | Admitting: Family Medicine

## 2022-07-25 VITALS — BP 144/80 | HR 70 | Temp 98.2°F | Resp 12 | Ht 61.0 in | Wt 258.0 lb

## 2022-07-25 DIAGNOSIS — I10 Essential (primary) hypertension: Secondary | ICD-10-CM | POA: Diagnosis not present

## 2022-07-25 DIAGNOSIS — R7303 Prediabetes: Secondary | ICD-10-CM

## 2022-07-25 NOTE — Assessment & Plan Note (Addendum)
Relatively controlled. BP in office today was elevated 144 / 80. Had not taken her losartan prior to visit. May have factor of white coat hypertension as well. Continue Losartan 40 mg daily  Take log of at home blood pressures

## 2022-07-25 NOTE — Progress Notes (Signed)
Established Patient Office Visit  Subjective   Patient ID: Kendra Kim, female    DOB: Jun 24, 1970  Age: 52 y.o. MRN: [id number]  Chief Complaint  Patient presents with   Medical Management of Chronic Issues    06/27/22 Increase lisinopril to 40 mg daily. Patient reports good compliance and tolerance with medications. She is not checking bp at home. She reports taking medication at lunch time. Has not taken med today.     Kendra Kim is here today for medical management of chronic conditions. She believes her blood pressure is doing good. She has not had symptoms of high or low blood pressure.  She has no major concerns or questions.    Review of Systems  Eyes:  Negative for blurred vision.  Neurological:  Negative for dizziness.     Objective:     BP (!) 144/80 (BP Location: Left Arm, Patient Position: Sitting, Cuff Size: Large)   Pulse 70   Temp 98.2 F (36.8 C) (Temporal)   Resp 12   Ht 5\' 1"  (1.549 m)   Wt 258 lb (117 kg)   SpO2 100%   BMI 48.75 kg/m  BP Readings from Last 3 Encounters:  07/25/22 (!) 144/80  06/27/22 (!) 148/91  10/31/21 129/82     Physical Exam Constitutional:      Appearance: Normal appearance.  Cardiovascular:     Rate and Rhythm: Normal rate and regular rhythm.     Heart sounds: Normal heart sounds.  Pulmonary:     Effort: Pulmonary effort is normal.     Breath sounds: Normal breath sounds.  Neurological:     General: No focal deficit present.     Mental Status: She is alert and oriented to person, place, and time.     No results found for any visits on 07/25/22.  Last metabolic panel Lab Results  Component Value Date   GLUCOSE 111 (H) 06/27/2022   NA 141 06/27/2022   K 4.7 06/27/2022   CL 103 06/27/2022   CO2 21 06/27/2022   BUN 13 06/27/2022   CREATININE 0.79 06/27/2022   EGFR 90 06/27/2022   CALCIUM 9.3 06/27/2022   PROT 7.4 06/27/2022   ALBUMIN 4.5 06/27/2022   LABGLOB 2.9 06/27/2022   AGRATIO 1.6 08/05/2021    BILITOT 0.3 06/27/2022   ALKPHOS 154 (H) 06/27/2022   ALKPHOS 154 (H) 06/27/2022   AST 18 06/27/2022   ALT 18 06/27/2022   ANIONGAP 9 09/24/2020   Last lipids Lab Results  Component Value Date   CHOL 197 06/27/2022   HDL 64 06/27/2022   LDLCALC 118 (H) 06/27/2022   TRIG 86 06/27/2022   CHOLHDL 3.1 06/27/2022   Last hemoglobin A1c Lab Results  Component Value Date   HGBA1C 5.7 (H) 06/27/2022    The 10-year ASCVD risk score (Arnett DK, et al., 2019) is: 2.1%    Assessment & Plan:   Problem List Items Addressed This Visit       Cardiovascular and Mediastinum   Primary hypertension - Primary    Relatively controlled. BP in office today was elevated 144 / 80. Had not taken her losartan prior to visit. May have factor of white coat hypertension as well. Continue Losartan 40 mg daily  Take log of at home blood pressures         Other   Prediabetes    Stable. Latest HbgA1c in June was 5.7. CTM Encourage lifestyle modifications       Return in about 4 weeks (  around 08/22/2022) for BP f/u.    Rometta Emery, Medical Student  Patient seen along with MS3 student Kendra Kim. I personally evaluated this patient along with the student, and verified all aspects of the history, physical exam, and medical decision making as documented by the student. I agree with the student's documentation and have made all necessary edits.  Aveion Nguyen, Marzella Schlein, MD, MPH [facility]

## 2022-07-25 NOTE — Assessment & Plan Note (Signed)
Stable. Latest HbgA1c in June was 5.7. CTM Encourage lifestyle modifications

## 2022-08-02 ENCOUNTER — Encounter: Payer: Self-pay | Admitting: Family Medicine

## 2022-08-07 ENCOUNTER — Ambulatory Visit
Admission: RE | Admit: 2022-08-07 | Discharge: 2022-08-07 | Disposition: A | Discharge status: Home / Self Care | Payer: 59 | Source: Ambulatory Visit | Attending: Family Medicine | Admitting: Family Medicine

## 2022-08-07 DIAGNOSIS — Z1231 Encounter for screening mammogram for malignant neoplasm of breast: Secondary | ICD-10-CM | POA: Insufficient documentation

## 2022-08-09 ENCOUNTER — Telehealth: Payer: Self-pay | Admitting: Family Medicine

## 2022-08-09 NOTE — Telephone Encounter (Signed)
Patient came in to pick up health assessment

## 2022-08-28 ENCOUNTER — Ambulatory Visit: Payer: 59 | Admitting: Family Medicine

## 2022-08-28 ENCOUNTER — Encounter: Payer: Self-pay | Admitting: Family Medicine

## 2022-08-28 VITALS — BP 128/74 | HR 85 | Temp 98.3°F | Resp 12 | Ht 61.0 in | Wt 255.0 lb

## 2022-08-28 DIAGNOSIS — I10 Essential (primary) hypertension: Secondary | ICD-10-CM

## 2022-08-28 NOTE — Assessment & Plan Note (Signed)
Blood pressure well controlled in office. Currently on Lisinopril 40mg  daily. -Continue Lisinopril 40mg  daily. -Encourage consistent home blood pressure monitoring and record keeping.

## 2022-08-28 NOTE — Progress Notes (Signed)
Established Patient Office Visit  Subjective   Patient ID: Kendra Kim, female    DOB: 06-10-1970  Age: 52 y.o. MRN: [id number]  Chief Complaint  Patient presents with   Medical Management of Chronic Issues    Patient reports good compliance and tolerance to medications. She is checking her BP at home. 8/13/24153/91, 08/16/22 159/92, 08/17/22 131/106, 162/92, 08/19/22 134/82, 08/22/22 149/88.    HPI  Discussed the use of AI scribe software for clinical note transcription with the patient, who gave verbal consent to proceed.  History of Present Illness   The patient, a 52 year old with a history of hypertension, pre-diabetes, and morbid obesity, was last seen a month ago for follow-up of hypertension. At that time, she had not taken her lisinopril prior to the visit and her blood pressure was elevated. She was advised to continue her lisinopril 40 mg daily, monitor her home blood pressures, and follow up today. The patient reports that her home blood pressure readings have been high (repeat readings are sitting for 5 minutes are normal though), despite her blood pressure appearing normal during today's visit. She denies feeling stressed or keyed up.         ROS per HPI    Objective:     BP 128/74 (BP Location: Left Arm, Patient Position: Sitting, Cuff Size: Large)   Pulse 85   Temp 98.3 F (36.8 C) (Temporal)   Resp 12   Ht 5\' 1"  (1.549 m)   Wt 255 lb (115.7 kg)   SpO2 99%   BMI 48.18 kg/m    Physical Exam Vitals reviewed.  Constitutional:      General: She is not in acute distress.    Appearance: Normal appearance. She is well-developed. She is not diaphoretic.  HENT:     Head: Normocephalic and atraumatic.  Eyes:     General: No scleral icterus.    Conjunctiva/sclera: Conjunctivae normal.  Neck:     Thyroid: No thyromegaly.  Cardiovascular:     Rate and Rhythm: Normal rate and regular rhythm.     Heart sounds: Normal heart sounds. No murmur heard. Pulmonary:      Effort: Pulmonary effort is normal. No respiratory distress.     Breath sounds: Normal breath sounds. No wheezing, rhonchi or rales.  Musculoskeletal:     Cervical back: Neck supple.     Right lower leg: No edema.     Left lower leg: No edema.  Lymphadenopathy:     Cervical: No cervical adenopathy.  Skin:    General: Skin is warm and dry.     Findings: No rash.  Neurological:     Mental Status: She is alert and oriented to person, place, and time. Mental status is at baseline.  Psychiatric:        Mood and Affect: Mood normal.        Behavior: Behavior normal.      No results found for any visits on 08/28/22.    The 10-year ASCVD risk score (Arnett DK, et al., 2019) is: 1.7%    Assessment & Plan:   Problem List Items Addressed This Visit       Cardiovascular and Mediastinum   Primary hypertension - Primary    Blood pressure well controlled in office. Currently on Lisinopril 40mg  daily. -Continue Lisinopril 40mg  daily. -Encourage consistent home blood pressure monitoring and record keeping.           Return in about 4 months (around 12/28/2022) for chronic disease f/u.  Kendra Latch, MD

## 2022-08-29 ENCOUNTER — Encounter: Payer: Self-pay | Admitting: Family Medicine

## 2022-08-31 ENCOUNTER — Telehealth (INDEPENDENT_AMBULATORY_CARE_PROVIDER_SITE_OTHER): Payer: 59 | Admitting: Family Medicine

## 2022-08-31 ENCOUNTER — Other Ambulatory Visit: Payer: Self-pay

## 2022-08-31 ENCOUNTER — Encounter: Payer: Self-pay | Admitting: Family Medicine

## 2022-08-31 DIAGNOSIS — I10 Essential (primary) hypertension: Secondary | ICD-10-CM | POA: Diagnosis not present

## 2022-08-31 DIAGNOSIS — Z6841 Body Mass Index (BMI) 40.0 and over, adult: Secondary | ICD-10-CM

## 2022-08-31 DIAGNOSIS — R7303 Prediabetes: Secondary | ICD-10-CM | POA: Diagnosis not present

## 2022-08-31 MED ORDER — SEMAGLUTIDE-WEIGHT MANAGEMENT 0.25 MG/0.5ML ~~LOC~~ SOAJ
0.2500 mg | SUBCUTANEOUS | 0 refills | Status: AC
  Filled 2022-08-31 – 2022-09-12 (×3): qty 2, 28d supply, fill #0

## 2022-08-31 MED ORDER — SEMAGLUTIDE-WEIGHT MANAGEMENT 1 MG/0.5ML ~~LOC~~ SOAJ
1.0000 mg | SUBCUTANEOUS | 1 refills | Status: DC
  Filled 2022-08-31: qty 2, fill #0
  Filled 2022-10-30: qty 2, 28d supply, fill #0

## 2022-08-31 MED ORDER — SEMAGLUTIDE-WEIGHT MANAGEMENT 0.5 MG/0.5ML ~~LOC~~ SOAJ
0.5000 mg | SUBCUTANEOUS | 0 refills | Status: AC
  Filled 2022-08-31 – 2022-10-02 (×2): qty 2, 28d supply, fill #0

## 2022-08-31 NOTE — Progress Notes (Signed)
MyChart Video Visit    Virtual Visit via Video Note   This format is felt to be most appropriate for this patient at this time. Physical exam was limited by quality of the video and audio technology used for the visit.    Patient location: home Provider location: [facility] Persons involved in the visit: patient, provider  I discussed the limitations of evaluation and management by telemedicine and the availability of in person appointments. The patient expressed understanding and agreed to proceed.  Patient: Kendra Kim   DOB: January 13, 1970   52 y.o. Female  MRN: [id number] Visit Date: 08/31/2022  Today's healthcare provider: Shirlee Latch, MD   No chief complaint on file.  Subjective    HPI  Discussed the use of AI scribe software for clinical note transcription with the patient, who gave verbal consent to proceed.  History of Present Illness   The patient, with a BMI of 48, is interested in starting [facility] for weight loss. She was inspired by a colleague who had significant weight loss on the medication. She has been participating in [employer] for about a month and has lost approximately 8 pounds. She is open to the idea of injections and is not overly concerned about potential side effects, such as nausea and constipation. However, she had a previous negative experience with a weight loss medication that caused severe nausea. She is also aware of potential hair loss with significant weight loss but is not overly concerned due to her thick hair. She is planning to continue with Weight Watchers alongside the medication.      Doing weight watchers x1 months and down 8 lbs.  Medications: Outpatient Medications Prior to Visit  Medication Sig   BIOTIN PO Take 1 tablet by mouth daily.   lisinopril (ZESTRIL) 40 MG tablet Take 1 tablet (40 mg total) by mouth daily.   Multiple Vitamin (MULTIVITAMIN) capsule Take 1 capsule daily by mouth.   Probiotic  Product (PROBIOTIC DAILY PO) Take 1 capsule by mouth daily.   No facility-administered medications prior to visit.    Review of Systems per HPI      Objective    There were no vitals taken for this visit.      Physical Exam Constitutional:      General: She is not in acute distress.    Appearance: Normal appearance.  HENT:     Head: Normocephalic.  Pulmonary:     Effort: Pulmonary effort is normal. No respiratory distress.  Neurological:     Mental Status: She is alert and oriented to person, place, and time. Mental status is at baseline.        Assessment & Plan     Problem List Items Addressed This Visit       Cardiovascular and Mediastinum   Primary hypertension     Other   Morbid obesity (HCC) - Primary   Relevant Medications   Semaglutide-Weight Management 0.25 MG/0.5ML SOAJ   Semaglutide-Weight Management 0.5 MG/0.5ML SOAJ (Start on 09/29/2022)   Semaglutide-Weight Management 1 MG/0.5ML SOAJ (Start on 10/28/2022)   Prediabetes        Obesity Patient interested in starting [facility] for weight loss. Discussed the mechanism of action, potential side effects (nausea, constipation), and the importance of maintaining a balanced diet with adequate protein. Patient has already lost weight with Weight Watchers and plans to continue this program in conjunction with Wegovy. [facility] with a dose escalation plan:0.25mg  weekly for 4 weeks, then  0.5mg  weekly for 4 weeks, then 1mg  weekly. -Send prescription to [facility]. -Check in with patient in 3 months to assess tolerance and effectiveness of Wegovy. If patient experiences significant nausea before then, consider maintaining current dose instead of escalating. -Encourage patient to continue with Weight Watchers program.        Return in about 3 months (around 12/01/2022) for weight f/u, virtual ok.     I discussed the assessment and treatment plan with the patient. The patient was  provided an opportunity to ask questions and all were answered. The patient agreed with the plan and demonstrated an understanding of the instructions.   The patient was advised to call back or seek an in-person evaluation if the symptoms worsen or if the condition fails to improve as anticipated.  Shirlee Latch, MD [facility] [phone] (phone) [phone] (fax)  [facility]

## 2022-09-06 ENCOUNTER — Encounter: Payer: Self-pay | Admitting: Pharmacist

## 2022-09-06 ENCOUNTER — Other Ambulatory Visit: Payer: Self-pay

## 2022-09-06 ENCOUNTER — Telehealth: Payer: Self-pay | Admitting: Family Medicine

## 2022-09-06 NOTE — Telephone Encounter (Signed)
[facility] is requesting prior authorization Key: [id number] Name: Nathally Benavente 0.25mg /0.5ML auto injectors

## 2022-09-11 NOTE — Telephone Encounter (Signed)
PA initiated

## 2022-09-12 ENCOUNTER — Other Ambulatory Visit: Payer: Self-pay

## 2022-09-12 NOTE — Telephone Encounter (Signed)
Your prior authorization for Kendra Kim has been approved! More Info Personalized support and financial assistance may be available through the [employer] program. For more information, and to see program requirements, click on the More Info button to the right.  Message from plan: Request Reference Number: [id number]. WEGOVY INJ 0.25MG  is approved through 04/12/2023. Your patient may now fill this prescription and it will be covered.. Authorization Expiration Date: April 12, 2023.  Message with approval was send to patient via mychart.

## 2022-09-14 ENCOUNTER — Other Ambulatory Visit: Payer: Self-pay

## 2022-10-02 ENCOUNTER — Other Ambulatory Visit: Payer: Self-pay

## 2022-10-03 ENCOUNTER — Encounter: Payer: Self-pay | Admitting: Family Medicine

## 2022-10-03 NOTE — Telephone Encounter (Signed)
I have pended Lisinopril but patient is asking for omeprazole

## 2022-10-05 ENCOUNTER — Other Ambulatory Visit: Payer: Self-pay

## 2022-10-05 MED ORDER — LISINOPRIL 40 MG PO TABS
40.0000 mg | ORAL_TABLET | Freq: Every day | ORAL | 1 refills | Status: DC
  Filled 2022-10-05: qty 30, 30d supply, fill #0
  Filled 2022-10-06: qty 90, 90d supply, fill #0
  Filled 2023-02-19: qty 30, 30d supply, fill #0
  Filled 2023-04-09: qty 30, 30d supply, fill #1
  Filled 2023-06-19: qty 30, 30d supply, fill #2

## 2022-10-05 MED ORDER — OMEPRAZOLE 20 MG PO CPDR
20.0000 mg | DELAYED_RELEASE_CAPSULE | Freq: Every day | ORAL | 3 refills | Status: DC
  Filled 2022-10-05: qty 30, 30d supply, fill #0
  Filled 2023-02-19: qty 30, 30d supply, fill #1
  Filled 2023-04-09: qty 30, 30d supply, fill #2
  Filled 2023-06-19: qty 30, 30d supply, fill #3

## 2022-10-06 ENCOUNTER — Other Ambulatory Visit: Payer: Self-pay

## 2022-10-11 ENCOUNTER — Other Ambulatory Visit: Payer: Self-pay | Admitting: Family Medicine

## 2022-10-11 NOTE — Telephone Encounter (Signed)
Unable to refill per protocol, Rx expired. Discontinued 10/31/21.  Requested Prescriptions  Pending Prescriptions Disp Refills   lisinopril (ZESTRIL) 20 MG tablet [Pharmacy Med Name: LISINOPRIL 20MG  TABLETS] 90 tablet 1    Sig: TAKE 1 TABLET(20 MG) BY MOUTH DAILY     Cardiovascular:  ACE Inhibitors Passed - 10/11/2022 12:48 PM      Passed - Cr in normal range and within 180 days    Creatinine, Ser  Date Value Ref Range Status  06/27/2022 0.79 0.57 - 1.00 mg/dL Final         Passed - K in normal range and within 180 days    Potassium  Date Value Ref Range Status  06/27/2022 4.7 3.5 - 5.2 mmol/L Final         Passed - Patient is not pregnant      Passed - Last BP in normal range    BP Readings from Last 1 Encounters:  08/28/22 128/74         Passed - Valid encounter within last 6 months    Recent Outpatient Visits           1 month ago Morbid obesity [facility])   [facility] [place], Marzella Schlein, MD   1 month ago Primary hypertension   [place] [facility] [place], Marzella Schlein, MD   2 months ago Primary hypertension   [facility] [place], Marzella Schlein, MD   3 months ago Encounter for annual physical exam   [place] [facility] [place], Marzella Schlein, MD   11 months ago Primary hypertension   [facility], Marzella Schlein, MD       Future Appointments             In 1 month Bacigalupo, Marzella Schlein, MD [facility]   In 2 months Bacigalupo, Marzella Schlein, MD [facility]   In 8 months Bacigalupo, Marzella Schlein, MD [facility], [facility]

## 2022-10-23 NOTE — Telephone Encounter (Signed)
Called [facility] and pharmacist stated prescription has been ready for a week. Verified prescription. Pharmacist said it is the 40 mg of Lisinopril. Last refill. Called patient and advised.

## 2022-10-30 ENCOUNTER — Other Ambulatory Visit: Payer: Self-pay

## 2022-11-21 ENCOUNTER — Other Ambulatory Visit: Payer: Self-pay

## 2022-11-21 ENCOUNTER — Telehealth (INDEPENDENT_AMBULATORY_CARE_PROVIDER_SITE_OTHER): Payer: 59 | Admitting: Family Medicine

## 2022-11-21 ENCOUNTER — Encounter: Payer: Self-pay | Admitting: Family Medicine

## 2022-11-21 DIAGNOSIS — Z6841 Body Mass Index (BMI) 40.0 and over, adult: Secondary | ICD-10-CM | POA: Diagnosis not present

## 2022-11-21 DIAGNOSIS — K5900 Constipation, unspecified: Secondary | ICD-10-CM | POA: Diagnosis not present

## 2022-11-21 DIAGNOSIS — I10 Essential (primary) hypertension: Secondary | ICD-10-CM

## 2022-11-21 MED ORDER — SEMAGLUTIDE-WEIGHT MANAGEMENT 1.7 MG/0.75ML ~~LOC~~ SOAJ
1.7000 mg | SUBCUTANEOUS | 3 refills | Status: DC
  Filled 2022-11-21: qty 3, 28d supply, fill #0
  Filled 2022-12-21: qty 3, 28d supply, fill #1
  Filled 2023-01-10 – 2023-01-15 (×2): qty 3, 28d supply, fill #2
  Filled 2023-02-19 – 2023-02-20 (×2): qty 3, 28d supply, fill #3

## 2022-11-21 NOTE — Progress Notes (Signed)
MyChart Video Visit    Virtual Visit via Video Note   This format is felt to be most appropriate for this patient at this time. Physical exam was limited by quality of the video and audio technology used for the visit.    Patient location: home Provider location: [facility] Persons involved in the visit: patient, provider  I discussed the limitations of evaluation and management by telemedicine and the availability of in person appointments. The patient expressed understanding and agreed to proceed.  Patient: Kendra Kim   DOB: 1970/11/17   52 y.o. Female  MRN: [id number] Visit Date: 11/21/2022  Today's healthcare provider: Shirlee Latch, MD   No chief complaint on file.  Subjective    HPI   Discussed the use of AI scribe software for clinical note transcription with the patient, who gave verbal consent to proceed.  History of Present Illness   The patient, a 52 year old with a history of morbid obesity and hypertension, presents for a follow-up visit after starting [facility] for weight loss three months ago. The patient reports significant weight loss of almost 30 pounds since starting the medication, with a weekly loss noted on what the patient refers to as "[facility]." The patient has tolerated the medication well, with no reported nausea. However, the patient has experienced constipation, which she has been managing with daily probiotics and fiber gummies. The patient also reports trying to maintain a diet high in protein and has been watching videos for dietary advice. The patient has been busy with work, which she believes has contributed to her weight loss. The patient also has a history of hypertension and is currently on 40mg  of lisinopril daily.         Review of Systems      Objective    Ht 5\' 1"  (1.549 m)   Wt 234 lb (106.1 kg)   BMI 44.21 kg/m       Physical Exam Constitutional:      General: She is not in acute  distress.    Appearance: Normal appearance.  HENT:     Head: Normocephalic.  Pulmonary:     Effort: Pulmonary effort is normal. No respiratory distress.  Neurological:     Mental Status: She is alert and oriented to person, place, and time. Mental status is at baseline.        Assessment & Plan     Problem List Items Addressed This Visit       Other   Morbid obesity (HCC) - Primary   Relevant Medications   Semaglutide-Weight Management 1.7 MG/0.75ML SOAJ (Start on 02/16/2023)        Morbid Obesity Currently on Wegovy 1 mg weekly, with significant weight loss of 30 pounds. No nausea but some constipation. Ready to increase to 1.7 mg weekly. Discussed potential for increased weight loss and side effects, including nausea. Patient adheres to dietary recommendations, including protein intake and hydration. - Increase Wegovy to 1.7 mg weekly after current 1 mg pens - Monitor for side effects, especially nausea, and adjust dosage if necessary - Follow up in three months to assess progress and check blood pressure  Constipation Managed with daily probiotics and fiber gummies. Emphasized hydration and dietary fiber intake. - Continue daily probiotics and fiber gummies - Increase water intake to at least 64 ounces daily - Incorporate more fruits and green vegetables into the diet  Hypertension On lisinopril 40 mg daily medication. Not regularly monitoring blood pressure. Weight loss may  necessitate dosage reduction. - Continue current medication at 40 mg daily - Monitor blood pressure periodically - Reassess blood pressure and medication dosage at next visit in three months  Follow-up - Schedule in-person follow-up in three months - Send prescription for 1.7 mg Wegovy to [facility] - Contact if experiencing significant side effects or if weight loss plateaus.          Return in about 3 months (around 02/21/2023) for weight f/u.     I discussed the assessment and  treatment plan with the patient. The patient was provided an opportunity to ask questions and all were answered. The patient agreed with the plan and demonstrated an understanding of the instructions.   The patient was advised to call back or seek an in-person evaluation if the symptoms worsen or if the condition fails to improve as anticipated.    Shirlee Latch, MD [facility] [phone] (phone) [phone] (fax)  [facility]

## 2022-11-27 ENCOUNTER — Other Ambulatory Visit: Payer: Self-pay

## 2022-12-19 ENCOUNTER — Ambulatory Visit: Payer: 59 | Admitting: Family Medicine

## 2022-12-21 ENCOUNTER — Other Ambulatory Visit: Payer: Self-pay

## 2023-01-09 ENCOUNTER — Other Ambulatory Visit: Payer: Self-pay

## 2023-01-10 ENCOUNTER — Other Ambulatory Visit (HOSPITAL_COMMUNITY): Payer: Self-pay

## 2023-01-10 ENCOUNTER — Other Ambulatory Visit (HOSPITAL_BASED_OUTPATIENT_CLINIC_OR_DEPARTMENT_OTHER): Payer: Self-pay

## 2023-01-10 ENCOUNTER — Other Ambulatory Visit: Payer: Self-pay

## 2023-01-11 ENCOUNTER — Telehealth: Payer: Self-pay | Admitting: Family Medicine

## 2023-01-11 NOTE — Telephone Encounter (Signed)
 Covermymeds is requesting prior authorization Key: [id number] [facility] 1.7MG /0.75ML auto injectors

## 2023-01-15 ENCOUNTER — Other Ambulatory Visit: Payer: Self-pay

## 2023-01-15 ENCOUNTER — Other Ambulatory Visit (HOSPITAL_COMMUNITY): Payer: Self-pay

## 2023-01-16 ENCOUNTER — Telehealth: Payer: Self-pay | Admitting: Family Medicine

## 2023-01-16 ENCOUNTER — Other Ambulatory Visit: Payer: Self-pay

## 2023-01-16 NOTE — Telephone Encounter (Signed)
 Covermymeds is requesting prior authorization Key: [id number] [facility] has been started but needs further assistance

## 2023-01-16 NOTE — Telephone Encounter (Signed)
 Duplicate request

## 2023-01-19 ENCOUNTER — Other Ambulatory Visit: Payer: Self-pay | Admitting: Family Medicine

## 2023-01-19 NOTE — Telephone Encounter (Signed)
Requested Prescriptions  Refused Prescriptions Disp Refills   lisinopril (ZESTRIL) 40 MG tablet [Pharmacy Med Name: LISINOPRIL 40MG  TABLETS] 90 tablet 1    Sig: TAKE 1 TABLET(40 MG) BY MOUTH DAILY     Cardiovascular:  ACE Inhibitors Failed - 01/19/2023  1:49 PM      Failed - Cr in normal range and within 180 days    Creatinine, Ser  Date Value Ref Range Status  06/27/2022 0.79 0.57 - 1.00 mg/dL Final         Failed - K in normal range and within 180 days    Potassium  Date Value Ref Range Status  06/27/2022 4.7 3.5 - 5.2 mmol/L Final         Passed - Patient is not pregnant      Passed - Last BP in normal range    BP Readings from Last 1 Encounters:  08/28/22 128/74         Passed - Valid encounter within last 6 months    Recent Outpatient Visits           1 month ago Morbid obesity [facility])   [place] [facility] [place], Marzella Schlein, MD   4 months ago Morbid obesity [facility])   [facility] [place], Marzella Schlein, MD   4 months ago Primary hypertension   [place] [facility] [place], Marzella Schlein, MD   5 months ago Primary hypertension   [place] [facility] [place], Marzella Schlein, MD   6 months ago Encounter for annual physical exam   [place] [facility] [place], Marzella Schlein, MD       Future Appointments             In 1 month Bac[place], Marzella Schlein, MD [facility]   In 5 months Bacigalupo, Marzella Schlein, MD [facility]

## 2023-02-19 ENCOUNTER — Other Ambulatory Visit: Payer: Self-pay

## 2023-02-20 ENCOUNTER — Ambulatory Visit (INDEPENDENT_AMBULATORY_CARE_PROVIDER_SITE_OTHER): Payer: BC Managed Care – PPO | Admitting: Family Medicine

## 2023-02-20 ENCOUNTER — Encounter: Payer: Self-pay | Admitting: Family Medicine

## 2023-02-20 ENCOUNTER — Other Ambulatory Visit: Payer: Self-pay

## 2023-02-20 VITALS — BP 138/86 | HR 78 | Wt 226.8 lb

## 2023-02-20 DIAGNOSIS — I10 Essential (primary) hypertension: Secondary | ICD-10-CM

## 2023-02-20 DIAGNOSIS — R7303 Prediabetes: Secondary | ICD-10-CM

## 2023-02-20 DIAGNOSIS — Z6841 Body Mass Index (BMI) 40.0 and over, adult: Secondary | ICD-10-CM

## 2023-02-20 DIAGNOSIS — E782 Mixed hyperlipidemia: Secondary | ICD-10-CM | POA: Diagnosis not present

## 2023-02-20 HISTORY — DX: Mixed hyperlipidemia: E78.2

## 2023-02-20 MED ORDER — SEMAGLUTIDE-WEIGHT MANAGEMENT 1.7 MG/0.75ML ~~LOC~~ SOAJ: 1.7000 mg | SUBCUTANEOUS | 3 refills | Status: DC

## 2023-02-20 MED ORDER — LISINOPRIL 40 MG PO TABS: 40.0000 mg | ORAL_TABLET | Freq: Every day | ORAL | 1 refills | Status: DC

## 2023-02-20 NOTE — Assessment & Plan Note (Signed)
 Weight reduced from 262 lbs to 223 lbs using Wegovy 1.7 mg. Reports recent cravings and thoughts about food, possibly related to stress and emotional eating. Discussed potential increase to [facility] 2.4 mg if weight loss plateaus or cravings become unmanageable. Patient prefers to continue current dose and monitor progress. - Continue Wegovy 1.7 mg - Monitor weight and cravings - Consider increasing to Wegovy 2.4 mg if weight loss plateaus or cravings become unmanageable

## 2023-02-20 NOTE — Assessment & Plan Note (Signed)
 Recommend low carb diet Recheck A1c

## 2023-02-20 NOTE — Assessment & Plan Note (Signed)
 Elevated blood pressure likely exacerbated by stress related to insurance issues. Previous readings were well-controlled on lisinopril 40 mg daily. - Recheck blood pressure at the end of the visit - well controlled - Continue lisinopril 40 mg daily - Monitor blood pressure at home periodically

## 2023-02-20 NOTE — Assessment & Plan Note (Signed)
 Reviewed last lipid panel Not currently on a statin Recheck FLP and CMP Discussed diet and exercise

## 2023-02-20 NOTE — Progress Notes (Signed)
 Established patient visit   Patient: Kendra Kim   DOB: 1970-03-02   53 y.o. Female  MRN: [id number] Visit Date: 02/20/2023  Today's healthcare provider: Shirlee Latch, MD   Chief Complaint  Patient presents with   Follow-up    3 month weight follow-up. Last visit virtual and 5 month hypertension   Obesity   Hypertension    Monitor at home on occasions.    Pre-Diabetes    Prior visit with dietician: no Current diet: in general, a "healthy" diet  , on average, 3 meals per day, diabetic Current exercise: walking   Subjective    HPI HPI     Follow-up    Additional comments: 3 month weight follow-up. Last visit virtual and 5 month hypertension        Hypertension    Additional comments: Monitor at home on occasions.         Pre-Diabetes    Additional comments: Prior visit with dietician: no Current diet: in general, a "healthy" diet  , on average, 3 meals per day, diabetic Current exercise: walking      Last edited by Acey Lav, CMA on 02/20/2023  2:48 PM.       Discussed the use of AI scribe software for clinical note transcription with the patient, who gave verbal consent to proceed.  History of Present Illness   The patient, with a history of hypertension and obesity, presents with concerns about her high blood pressure reading at the beginning of the visit. She attributes this to anxiety related to her recent insurance change and personal family issues. She has been on Lisinopril 40mg  daily for her hypertension, which was well-controlled at her last visit.  Regarding her weight, she has been on Wegovy 1.7mg  and has lost almost 40 pounds since starting the medication. However, she reports starting to have cravings and thoughts about food again, which she had not experienced since starting the medication. Despite these thoughts, she has been able to maintain her reduced food intake and continues to lose weight.  The patient also expresses  significant stress and anxiety related to her insurance coverage for her Wegovy injections. She recently switched from [employer] to [employer] due to a letter stating that [employer] would no longer cover her [employer]. However, after the switch, she received another letter stating that [employer] had renegotiated and would continue to cover her doctors. This has resulted in a significant increase in the cost of her Wegovy injections, causing her significant financial stress.         Medications: Outpatient Medications Prior to Visit  Medication Sig   BIOTIN PO Take 1 tablet by mouth daily.   lisinopril (ZESTRIL) 40 MG tablet Take 1 tablet (40 mg total) by mouth daily.   Multiple Vitamin (MULTIVITAMIN) capsule Take 1 capsule daily by mouth.   omeprazole (PRILOSEC) 20 MG capsule Take 1 capsule (20 mg total) by mouth daily.   Probiotic Product (PROBIOTIC DAILY PO) Take 1 capsule by mouth daily.   Semaglutide-Weight Management 1.7 MG/0.75ML SOAJ Inject 1.7 mg into the skin once a week.   No facility-administered medications prior to visit.    Review of Systems      Objective    BP 138/86 (BP Location: Left Arm, Patient Position: Sitting, Cuff Size: Large)   Pulse 78   Wt 226 lb 12.8 oz (102.9 kg)   SpO2 100%   BMI 42.85 kg/m    Physical Exam Vitals  reviewed.  Constitutional:      General: She is not in acute distress.    Appearance: Normal appearance. She is well-developed. She is not diaphoretic.  HENT:     Head: Normocephalic and atraumatic.  Eyes:     General: No scleral icterus.    Conjunctiva/sclera: Conjunctivae normal.  Neck:     Thyroid: No thyromegaly.  Cardiovascular:     Rate and Rhythm: Normal rate and regular rhythm.     Heart sounds: Normal heart sounds. No murmur heard. Pulmonary:     Effort: Pulmonary effort is normal. No respiratory distress.     Breath sounds: Normal breath sounds. No wheezing, rhonchi or rales.   Musculoskeletal:     Cervical back: Neck supple.     Right lower leg: No edema.     Left lower leg: No edema.  Lymphadenopathy:     Cervical: No cervical adenopathy.  Skin:    General: Skin is warm and dry.     Findings: No rash.  Neurological:     Mental Status: She is alert and oriented to person, place, and time. Mental status is at baseline.  Psychiatric:        Mood and Affect: Mood normal.        Behavior: Behavior normal.      No results found for any visits on 02/20/23.  Assessment & Plan     Problem List Items Addressed This Visit       Cardiovascular and Mediastinum   Primary hypertension - Primary   Elevated blood pressure likely exacerbated by stress related to insurance issues. Previous readings were well-controlled on lisinopril 40 mg daily. - Recheck blood pressure at the end of the visit - well controlled - Continue lisinopril 40 mg daily - Monitor blood pressure at home periodically      Relevant Medications   lisinopril (ZESTRIL) 40 MG tablet   Other Relevant Orders   Comprehensive metabolic panel     Other   Morbid obesity (HCC)   Weight reduced from 262 lbs to 223 lbs using Wegovy 1.7 mg. Reports recent cravings and thoughts about food, possibly related to stress and emotional eating. Discussed potential increase to [facility] 2.4 mg if weight loss plateaus or cravings become unmanageable. Patient prefers to continue current dose and monitor progress. - Continue Wegovy 1.7 mg - Monitor weight and cravings - Consider increasing to Wegovy 2.4 mg if weight loss plateaus or cravings become unmanageable      Relevant Medications   Semaglutide-Weight Management 1.7 MG/0.75ML SOAJ   Other Relevant Orders   Hemoglobin A1c   Comprehensive metabolic panel   Lipid panel   Prediabetes   Recommend low carb diet Recheck A1c       Relevant Orders   Hemoglobin A1c   Moderate mixed hyperlipidemia not requiring statin therapy   Reviewed last lipid panel Not  currently on a statin Recheck FLP and CMP Discussed diet and exercise       Relevant Medications   lisinopril (ZESTRIL) 40 MG tablet   Other Relevant Orders   Comprehensive metabolic panel   Lipid panel        General Health Maintenance Routine labs including cholesterol, A1c, kidney, and liver function tests are due. - Order routine labs (cholesterol, A1c, kidney and liver function) - Schedule follow-up appointment for June 30th  Follow-up - Follow-up appointment on June 30th - Colonoscopy due in 2026.        Return in about 4 months (around 06/20/2023)  for CPE, as scheduled.       Shirlee Latch, MD  [facility] [phone] (phone) [phone] (fax)  [facility]

## 2023-02-21 LAB — COMPREHENSIVE METABOLIC PANEL
ALT: 28 [IU]/L (ref 0–32)
AST: 19 [IU]/L (ref 0–40)
Albumin: 4.4 g/dL (ref 3.8–4.9)
Alkaline Phosphatase: 177 [IU]/L — ABNORMAL HIGH (ref 44–121)
BUN/Creatinine Ratio: 21 (ref 9–23)
BUN: 18 mg/dL (ref 6–24)
Bilirubin Total: 0.2 mg/dL (ref 0.0–1.2)
CO2: 24 mmol/L (ref 20–29)
Calcium: 10 mg/dL (ref 8.7–10.2)
Chloride: 103 mmol/L (ref 96–106)
Creatinine, Ser: 0.86 mg/dL (ref 0.57–1.00)
Globulin, Total: 3.2 g/dL (ref 1.5–4.5)
Glucose: 98 mg/dL (ref 70–99)
Potassium: 4.8 mmol/L (ref 3.5–5.2)
Sodium: 140 mmol/L (ref 134–144)
Total Protein: 7.6 g/dL (ref 6.0–8.5)
eGFR: 81 mL/min/{1.73_m2} (ref >59)

## 2023-02-21 LAB — LIPID PANEL
Chol/HDL Ratio: 3.5 {ratio} (ref 0.0–4.4)
Cholesterol, Total: 180 mg/dL (ref 100–199)
HDL: 52 mg/dL (ref >39)
LDL Chol Calc (NIH): 106 mg/dL — ABNORMAL HIGH (ref 0–99)
Triglycerides: 126 mg/dL (ref 0–149)
VLDL Cholesterol Cal: 22 mg/dL (ref 5–40)

## 2023-02-21 LAB — HEMOGLOBIN A1C
Est. average glucose Bld gHb Est-mCnc: 105 mg/dL
Hgb A1c MFr Bld: 5.3 % (ref 4.8–5.6)

## 2023-02-22 ENCOUNTER — Encounter: Payer: Self-pay | Admitting: Family Medicine

## 2023-02-27 ENCOUNTER — Other Ambulatory Visit: Payer: Self-pay

## 2023-02-27 MED ORDER — WEGOVY 2.4 MG/0.75ML ~~LOC~~ SOAJ
2.4000 mg | SUBCUTANEOUS | 0 refills | Status: DC
Start: 2023-02-27 — End: 2023-04-17
  Filled 2023-02-27 – 2023-03-20 (×2): qty 3, 28d supply, fill #0

## 2023-03-05 ENCOUNTER — Other Ambulatory Visit: Payer: Self-pay

## 2023-03-20 ENCOUNTER — Other Ambulatory Visit: Payer: Self-pay

## 2023-04-09 ENCOUNTER — Other Ambulatory Visit: Payer: Self-pay

## 2023-04-10 ENCOUNTER — Other Ambulatory Visit: Payer: Self-pay

## 2023-04-11 ENCOUNTER — Other Ambulatory Visit: Payer: Self-pay

## 2023-04-12 ENCOUNTER — Other Ambulatory Visit: Payer: Self-pay

## 2023-04-13 ENCOUNTER — Other Ambulatory Visit: Payer: Self-pay

## 2023-04-16 ENCOUNTER — Other Ambulatory Visit: Payer: Self-pay

## 2023-04-16 ENCOUNTER — Encounter: Payer: Self-pay | Admitting: Family Medicine

## 2023-04-17 ENCOUNTER — Other Ambulatory Visit: Payer: Self-pay

## 2023-04-17 DIAGNOSIS — L820 Inflamed seborrheic keratosis: Secondary | ICD-10-CM | POA: Diagnosis not present

## 2023-04-17 DIAGNOSIS — L2989 Other pruritus: Secondary | ICD-10-CM | POA: Diagnosis not present

## 2023-04-17 DIAGNOSIS — L538 Other specified erythematous conditions: Secondary | ICD-10-CM | POA: Diagnosis not present

## 2023-04-17 DIAGNOSIS — L821 Other seborrheic keratosis: Secondary | ICD-10-CM | POA: Diagnosis not present

## 2023-04-17 MED ORDER — WEGOVY 2.4 MG/0.75ML ~~LOC~~ SOAJ
2.4000 mg | SUBCUTANEOUS | 5 refills | Status: DC
  Filled 2023-04-17: qty 3, 28d supply, fill #0
  Filled 2023-05-23: qty 3, 28d supply, fill #1
  Filled 2023-06-19: qty 3, 28d supply, fill #2
  Filled 2023-07-18: qty 3, 28d supply, fill #3

## 2023-04-18 ENCOUNTER — Other Ambulatory Visit: Payer: Self-pay

## 2023-05-04 ENCOUNTER — Other Ambulatory Visit: Payer: Self-pay

## 2023-05-11 IMAGING — DX DG KNEE 1-2V*R*
2 series · 2 of 2 positions shown · non-contrast
Comparison: None.

CLINICAL DATA: Status post right total knee arthroplasty

EXAM:
RIGHT KNEE - 1-2 VIEW

[knee ap]
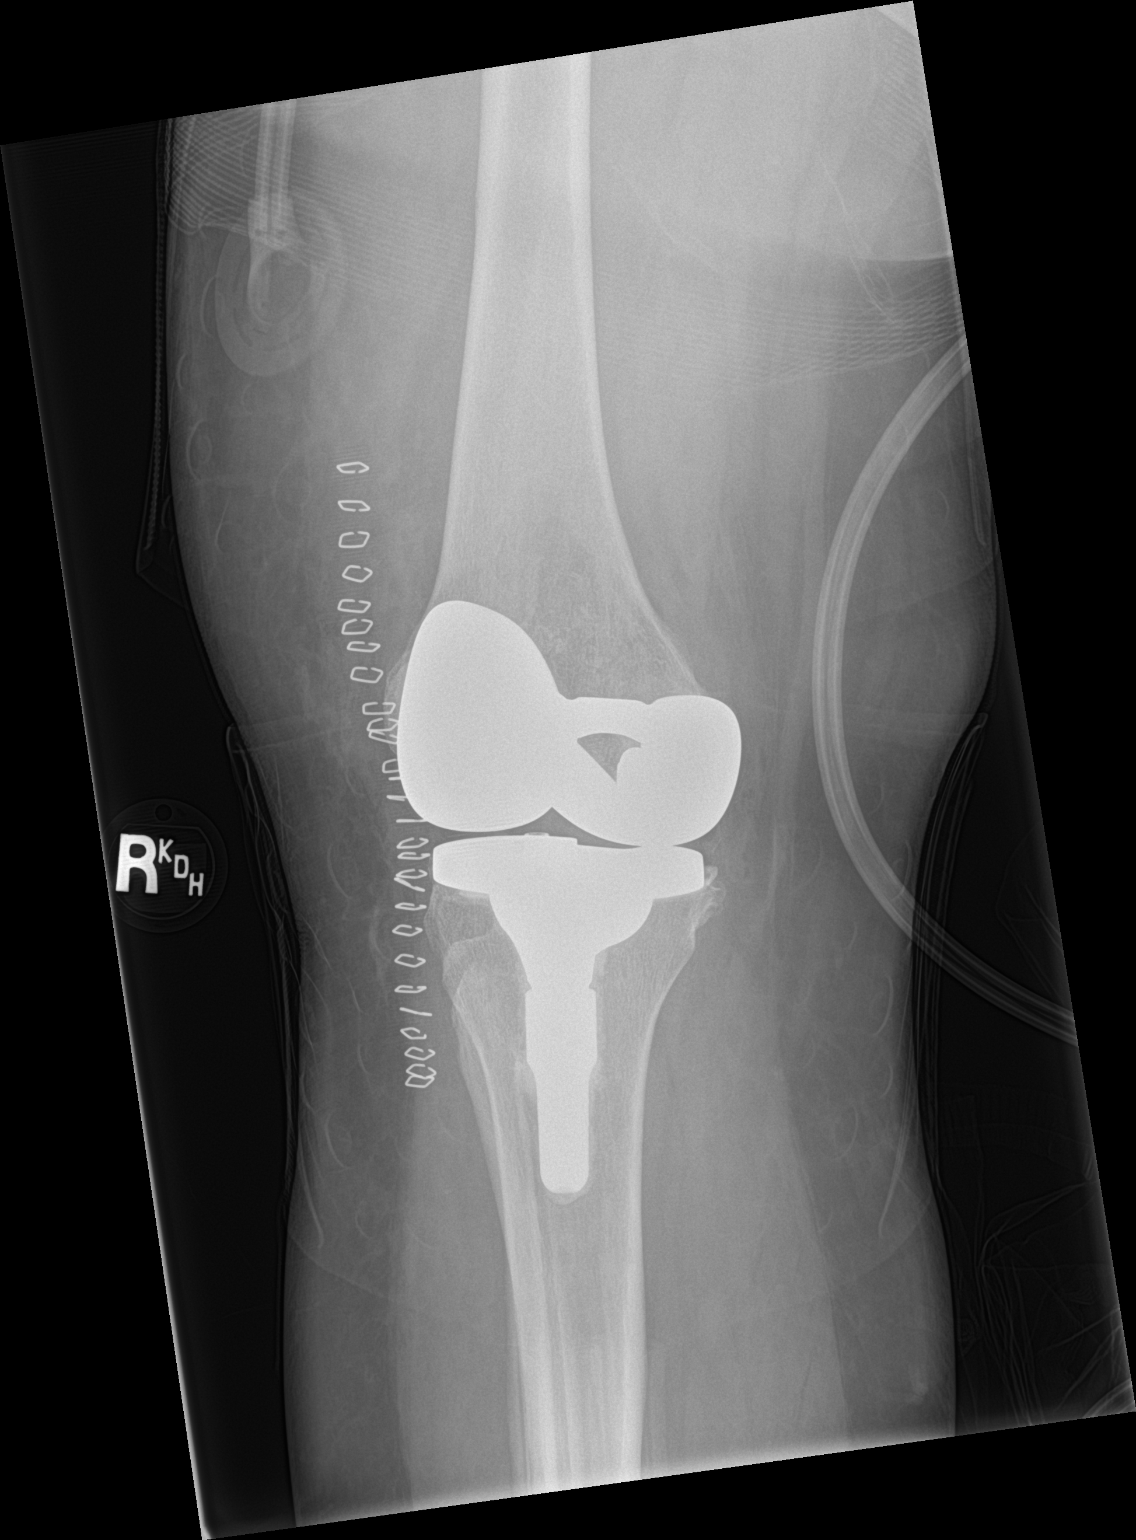

[knee lat]
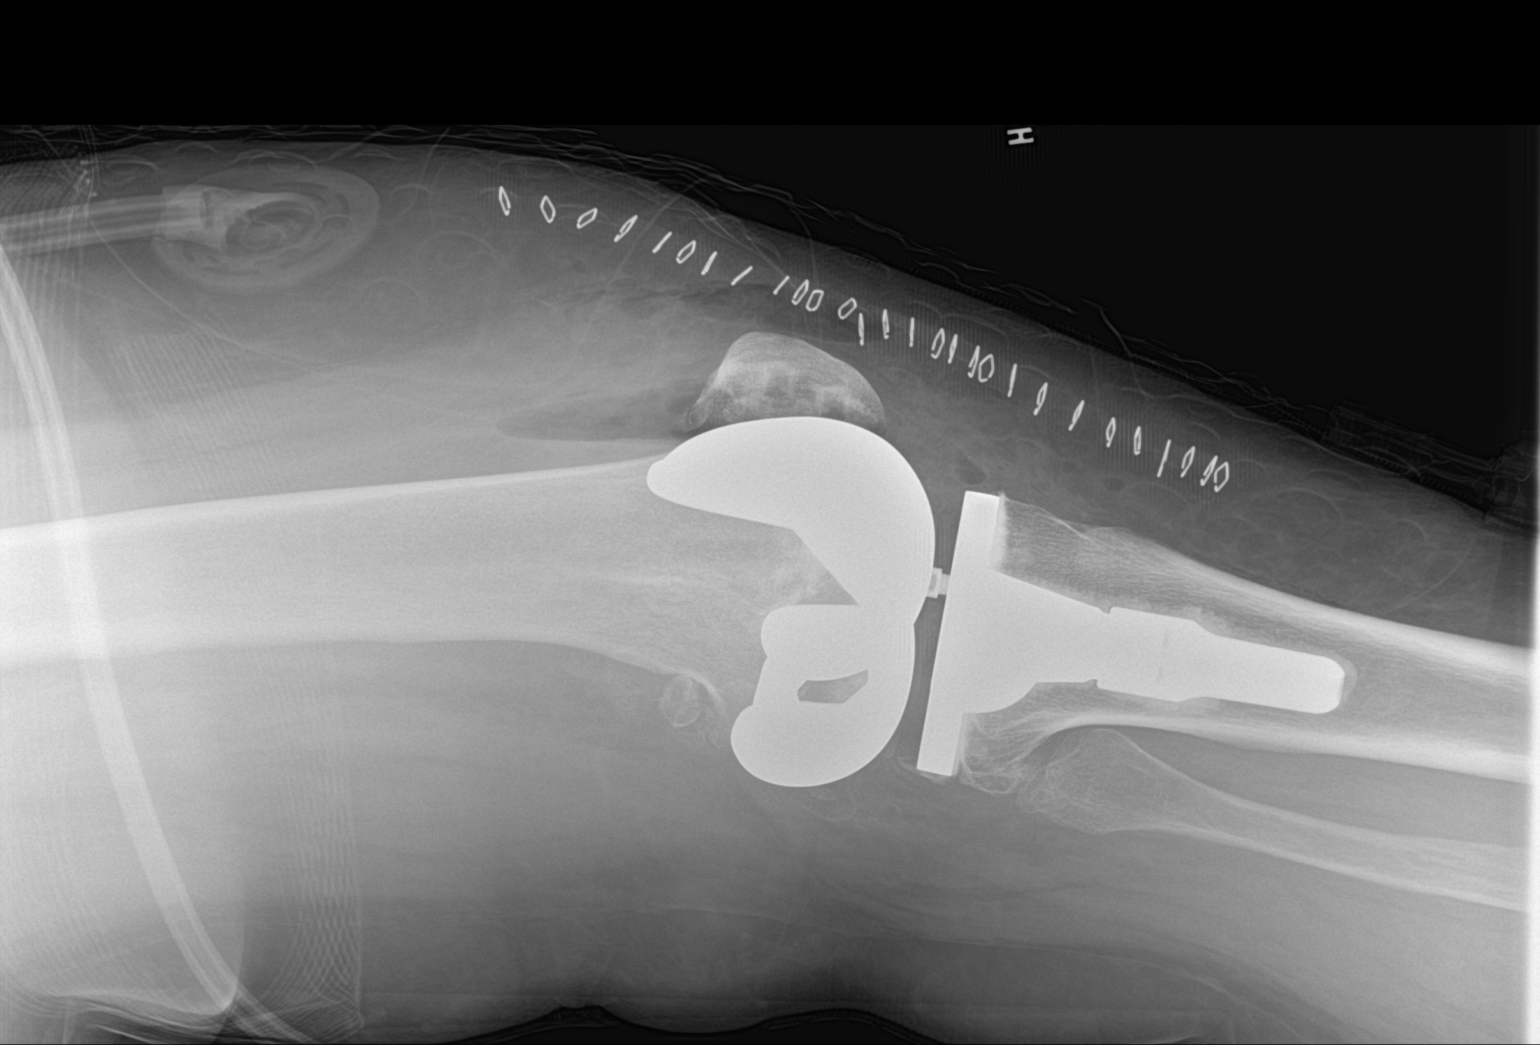

[2 of 2 positions shown; findings below may reference images not displayed]

FINDINGS: Status post right total knee arthroplasty with well-positioned
distal right femoral and proximal right tibial prostheses. Anterior
midline skin staples noted. Expected gas within and surrounding the
right knee joint. No bone fracture or dislocation. No suspicious
focal osseous lesions.
IMPRESSION: Satisfactory immediate postoperative appearance status post right
total knee arthroplasty.

## 2023-05-23 ENCOUNTER — Other Ambulatory Visit: Payer: Self-pay

## 2023-06-19 ENCOUNTER — Other Ambulatory Visit: Payer: Self-pay

## 2023-07-02 ENCOUNTER — Encounter: Payer: Self-pay | Admitting: Family Medicine

## 2023-07-10 ENCOUNTER — Other Ambulatory Visit: Payer: Self-pay | Admitting: Family Medicine

## 2023-07-10 DIAGNOSIS — Z1231 Encounter for screening mammogram for malignant neoplasm of breast: Secondary | ICD-10-CM

## 2023-07-17 DIAGNOSIS — L820 Inflamed seborrheic keratosis: Secondary | ICD-10-CM | POA: Diagnosis not present

## 2023-07-17 DIAGNOSIS — M71372 Other bursal cyst, left ankle and foot: Secondary | ICD-10-CM | POA: Diagnosis not present

## 2023-07-17 DIAGNOSIS — L814 Other melanin hyperpigmentation: Secondary | ICD-10-CM | POA: Diagnosis not present

## 2023-07-17 DIAGNOSIS — L570 Actinic keratosis: Secondary | ICD-10-CM | POA: Diagnosis not present

## 2023-07-17 DIAGNOSIS — L821 Other seborrheic keratosis: Secondary | ICD-10-CM | POA: Diagnosis not present

## 2023-07-18 ENCOUNTER — Other Ambulatory Visit: Payer: Self-pay

## 2023-07-23 ENCOUNTER — Ambulatory Visit (INDEPENDENT_AMBULATORY_CARE_PROVIDER_SITE_OTHER): Admitting: Family Medicine

## 2023-07-23 ENCOUNTER — Other Ambulatory Visit: Payer: Self-pay

## 2023-07-23 VITALS — BP 124/82 | HR 68 | Ht 61.0 in | Wt 216.6 lb

## 2023-07-23 DIAGNOSIS — R748 Abnormal levels of other serum enzymes: Secondary | ICD-10-CM | POA: Diagnosis not present

## 2023-07-23 DIAGNOSIS — R7303 Prediabetes: Secondary | ICD-10-CM | POA: Diagnosis not present

## 2023-07-23 DIAGNOSIS — I10 Essential (primary) hypertension: Secondary | ICD-10-CM | POA: Diagnosis not present

## 2023-07-23 DIAGNOSIS — Z0000 Encounter for general adult medical examination without abnormal findings: Secondary | ICD-10-CM | POA: Diagnosis not present

## 2023-07-23 DIAGNOSIS — E782 Mixed hyperlipidemia: Secondary | ICD-10-CM | POA: Diagnosis not present

## 2023-07-23 MED ORDER — WEGOVY 2.4 MG/0.75ML ~~LOC~~ SOAJ
2.4000 mg | SUBCUTANEOUS | 5 refills | Status: DC
  Filled 2023-08-14: qty 3, 28d supply, fill #0
  Filled 2023-09-11: qty 3, 28d supply, fill #1
  Filled 2023-10-04 – 2023-10-16 (×2): qty 3, 28d supply, fill #2
  Filled 2023-11-05 – 2023-11-08 (×5): qty 3, 28d supply, fill #3
  Filled 2023-12-06: qty 3, 28d supply, fill #4
  Filled 2023-12-29: qty 3, 28d supply, fill #5

## 2023-07-23 MED ORDER — LISINOPRIL 40 MG PO TABS
40.0000 mg | ORAL_TABLET | Freq: Every day | ORAL | 1 refills | Status: AC
  Filled 2023-07-23: qty 90, 90d supply, fill #0
  Filled 2023-09-11: qty 30, 30d supply, fill #0
  Filled 2023-10-16: qty 30, 30d supply, fill #1
  Filled 2023-12-06: qty 30, 30d supply, fill #2
  Filled 2023-12-29: qty 30, 30d supply, fill #3

## 2023-07-23 NOTE — Assessment & Plan Note (Signed)
 Blood pressure management with lisinopril . Current blood pressure is 124/82 mmHg, within target range. Lisinopril  dosage previously reduced from 40 mg. - Continue lisinopril  - Recheck blood pressure in 6 months

## 2023-07-23 NOTE — Assessment & Plan Note (Signed)
 Obesity management with Wegovy . Weight loss continues with a 10-pound reduction since last visit, though rate has slowed. Weight fluctuates with lifestyle factors. - Continue Wegovy  2.4 mg - Monitor weight and lifestyle factors

## 2023-07-23 NOTE — Assessment & Plan Note (Signed)
 A1c levels have improved to normal range. Previous labs indicate improving cholesterol levels. Regular monitoring of A1c and cholesterol planned. - Order A1c and cholesterol tests - Monitor kidney function and electrolytes

## 2023-07-23 NOTE — Progress Notes (Signed)
 Complete physical exam   Patient: Kendra Kim   DOB: [birth year]   53 y.o. Female  MRN: [id number] Visit Date: 07/23/2023  Today's healthcare provider: Jon Eva, MD   Chief Complaint  Patient presents with   Annual Exam    Last completed 06/27/22 Diet -  low carb, weight watcher diet Exercise - three times a week for 20 minutes Feeling - well Sleeping - well Concerns -  none   Subjective    Kendra Kim is a 53 y.o. female who presents today for a complete physical exam.   Discussed the use of AI scribe software for clinical note transcription with the patient, who gave verbal consent to proceed.  History of Present Illness   Kendra Kim is a 53 year old female who presents for an annual physical exam.  She maintains her health proactively, with a mammogram scheduled for August, a colonoscopy due next year, and a Pap smear not needed until 2029. Her hepatitis B vaccination status is unclear but likely completed due to occupational requirements.  Her A1c levels have normalized, and cholesterol levels have decreased since the last visit. She is on Wegovy  2.4 mg for weight management and has lost ten pounds since the last visit, though the rate of weight loss has slowed. Weight fluctuations occur depending on her activities and location.  She takes lisinopril  for hypertension, with a recent dosage adjustment. Blood pressure readings vary with activity and stress levels, though she does not feel anxious.  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        Last depression screening scores    07/23/2023    3:25 PM 02/20/2023    2:50 PM 08/28/2022    1:21 PM  PHQ 2/9 Scores  PHQ - 2 Score 0 0 0  PHQ- 9 Score   0   Last fall risk screening    07/23/2023    3:25 PM  Fall Risk   Falls in the past year? 0  Number falls in past yr: 0  Injury with  Fall? 0  Risk for fall due to : No Fall Risks  Follow up Falls evaluation completed        Medications: Outpatient Medications Prior to Visit  Medication Sig   BIOTIN PO Take 1 tablet by mouth daily.   Multiple Vitamin (MULTIVITAMIN) capsule Take 1 capsule daily by mouth.   omeprazole  (PRILOSEC) 20 MG capsule Take 1 capsule (20 mg total) by mouth daily.   Probiotic Product (PROBIOTIC DAILY PO) Take 1 capsule by mouth daily.   [DISCONTINUED] lisinopril  (ZESTRIL ) 40 MG tablet Take 1 tablet (40 mg total) by mouth daily.   [DISCONTINUED] lisinopril  (ZESTRIL ) 40 MG tablet Take 1 tablet (40 mg total) by mouth daily.   [DISCONTINUED] WEGOVY  2.4 MG/0.75ML SOAJ Inject 2.4 mg into the skin once a week for weight loss.   No facility-administered medications prior to visit.    Review of Systems    Objective    BP 124/82 (BP Location: Left Arm, Patient Position: Sitting, Cuff Size: Large)   Pulse 68   Ht 5' 1 (1.549 m)   Wt 216 lb 9.6 oz (98.2 kg)   SpO2 100%   BMI 40.93 kg/m    Physical Exam Vitals reviewed.  Constitutional:      General: She is not in acute distress.  Appearance: Normal appearance. She is well-developed. She is not diaphoretic.  HENT:     Head: Normocephalic and atraumatic.     Right Ear: Ear canal and external ear normal.     Left Ear: Ear canal and external ear normal.     Nose: Nose normal.     Mouth/Throat:     Mouth: Mucous membranes are moist.     Pharynx: Oropharynx is clear. No oropharyngeal exudate.  Eyes:     General: No scleral icterus.    Conjunctiva/sclera: Conjunctivae normal.     Pupils: Pupils are equal, round, and reactive to light.  Neck:     Thyroid : No thyromegaly.  Cardiovascular:     Rate and Rhythm: Normal rate and regular rhythm.     Heart sounds: Normal heart sounds. No murmur heard. Pulmonary:     Effort: Pulmonary effort is normal. No respiratory distress.     Breath sounds: Normal breath sounds. No wheezing or rales.   Abdominal:     General: There is no distension.     Palpations: Abdomen is soft.     Tenderness: There is no abdominal tenderness.  Musculoskeletal:        General: No deformity.     Cervical back: Neck supple.     Right lower leg: No edema.     Left lower leg: No edema.  Lymphadenopathy:     Cervical: No cervical adenopathy.  Skin:    General: Skin is warm and dry.     Findings: No rash.  Neurological:     Mental Status: She is alert and oriented to person, place, and time. Mental status is at baseline.     Gait: Gait normal.  Psychiatric:        Mood and Affect: Mood normal.        Behavior: Behavior normal.        Thought Content: Thought content normal.      No results found for any visits on 07/23/23.  Assessment & Plan    Routine Health Maintenance and Physical Exam  Exercise Activities and Dietary recommendations  Goals   None     Immunization History  Administered Date(s) Administered   Moderna Sars-Covid-2 Vaccination 08/21/2019, 09/22/2019   Tdap 11/08/2016    Health Maintenance  Topic Date Due   Hepatitis B Vaccines (1 of 3 - 19+ 3-dose series) Never done   Zoster Vaccines- Shingrix (1 of 2) Never done   COVID-19 Vaccine (3 - 2024-25 season) 09/03/2022   INFLUENZA VACCINE  08/03/2023   Colonoscopy  06/11/2024   MAMMOGRAM  08/06/2024   DTaP/Tdap/Td (2 - Td or Tdap) 11/09/2026   Cervical Cancer Screening (HPV/Pap Cotest)  06/27/2027   Hepatitis C Screening  Completed   HIV Screening  Completed   HPV VACCINES  Aged Out   Meningococcal B Vaccine  Aged Out    Discussed health benefits of physical activity, and encouraged her to engage in regular exercise appropriate for her age and condition.  Problem List Items Addressed This Visit       Cardiovascular and Mediastinum   Primary hypertension   Blood pressure management with lisinopril . Current blood pressure is 124/82 mmHg, within target range. Lisinopril  dosage previously reduced from 40 mg. -  Continue lisinopril  - Recheck blood pressure in 6 months      Relevant Medications   lisinopril  (ZESTRIL ) 40 MG tablet   Other Relevant Orders   Comprehensive metabolic panel with GFR     Other  Morbid obesity (HCC)   Obesity management with Wegovy . Weight loss continues with a 10-pound reduction since last visit, though rate has slowed. Weight fluctuates with lifestyle factors. - Continue Wegovy  2.4 mg - Monitor weight and lifestyle factors      Relevant Medications   WEGOVY  2.4 MG/0.75ML SOAJ   Prediabetes   A1c levels have improved to normal range. Previous labs indicate improving cholesterol levels. Regular monitoring of A1c and cholesterol planned. - Order A1c and cholesterol tests - Monitor kidney function and electrolytes      Relevant Orders   Hemoglobin A1c   Moderate mixed hyperlipidemia not requiring statin therapy   Reviewed last lipid panel Not currently on a statin Recheck FLP and CMP Discussed diet and exercise       Relevant Medications   lisinopril  (ZESTRIL ) 40 MG tablet   Other Relevant Orders   Comprehensive metabolic panel with GFR   Lipid panel   Other Visit Diagnoses       Encounter for annual physical exam    -  Primary   Relevant Orders   Hemoglobin A1c   Comprehensive metabolic panel with GFR   Lipid panel           Adult Wellness Visit Routine adult wellness visit with biometric form completion pending February labs. Blood pressure and waist measurement required for form accuracy. - Recheck blood pressure - Measure waist circumference  General Health Maintenance Routine health maintenance discussed. Mammogram scheduled for August. Colonoscopy due next year. Pap smear not due until 2029. Tetanus vaccine due in 2028. Hepatitis B vaccination status likely completed. - Schedule colonoscopy next year - Continue routine mammogram screenings - Monitor tetanus vaccine schedule        Return in about 6 months (around 01/23/2024) for  chronic disease f/u.     Jon Eva, MD  [facility] [phone] (phone) [phone] (fax)  [facility]

## 2023-07-23 NOTE — Assessment & Plan Note (Signed)
 Reviewed last lipid panel Not currently on a statin Recheck FLP and CMP Discussed diet and exercise

## 2023-07-24 ENCOUNTER — Other Ambulatory Visit: Payer: Self-pay

## 2023-07-24 ENCOUNTER — Ambulatory Visit: Payer: Self-pay | Admitting: Family Medicine

## 2023-07-24 LAB — LIPID PANEL
Chol/HDL Ratio: 3.3 ratio (ref 0.0–4.4)
Cholesterol, Total: 179 mg/dL (ref 100–199)
HDL: 54 mg/dL (ref >39)
LDL Chol Calc (NIH): 106 mg/dL — ABNORMAL HIGH (ref 0–99)
Triglycerides: 104 mg/dL (ref 0–149)
VLDL Cholesterol Cal: 19 mg/dL (ref 5–40)

## 2023-07-24 LAB — COMPREHENSIVE METABOLIC PANEL WITH GFR
ALT: 29 IU/L (ref 0–32)
AST: 18 IU/L (ref 0–40)
Albumin: 4.5 g/dL (ref 3.8–4.9)
Alkaline Phosphatase: 178 IU/L — ABNORMAL HIGH (ref 44–121)
BUN/Creatinine Ratio: 18 (ref 9–23)
BUN: 14 mg/dL (ref 6–24)
Bilirubin Total: 0.2 mg/dL (ref 0.0–1.2)
CO2: 22 mmol/L (ref 20–29)
Calcium: 9.6 mg/dL (ref 8.7–10.2)
Chloride: 103 mmol/L (ref 96–106)
Creatinine, Ser: 0.8 mg/dL (ref 0.57–1.00)
Globulin, Total: 3 g/dL (ref 1.5–4.5)
Glucose: 87 mg/dL (ref 70–99)
Potassium: 4.4 mmol/L (ref 3.5–5.2)
Sodium: 138 mmol/L (ref 134–144)
Total Protein: 7.5 g/dL (ref 6.0–8.5)
eGFR: 88 mL/min/1.73 (ref >59)

## 2023-07-24 LAB — HEMOGLOBIN A1C
Est. average glucose Bld gHb Est-mCnc: 105 mg/dL
Hgb A1c MFr Bld: 5.3 % (ref 4.8–5.6)

## 2023-07-30 LAB — ALKALINE PHOSPHATASE ISOENZYMES
Alkaline Phosphatase: 176 IU/L — ABNORMAL HIGH (ref 44–121)
BONE FRACTION %:: 30 %
Bone Fraction IU/L:: 53 IU/L (ref 18–57)
INTESTINAL FRAC.%:: 1 %
INTESTINALFRAC.IU/L:: 2 IU/L (ref 0–14)
LIVER FRACTION %:: 69 %
Liver Fraction IU/L:: 121 IU/L — ABNORMAL HIGH (ref 23–85)

## 2023-07-30 LAB — SPECIMEN STATUS REPORT

## 2023-07-30 LAB — GAMMA GT: GGT: 58 IU/L (ref 0–60)

## 2023-08-08 ENCOUNTER — Ambulatory Visit
Admission: RE | Admit: 2023-08-08 | Discharge: 2023-08-08 | Disposition: A | Discharge status: Home / Self Care | Source: Ambulatory Visit | Attending: Family Medicine | Admitting: Family Medicine

## 2023-08-08 DIAGNOSIS — Z1231 Encounter for screening mammogram for malignant neoplasm of breast: Secondary | ICD-10-CM | POA: Diagnosis not present

## 2023-08-13 ENCOUNTER — Other Ambulatory Visit: Payer: Self-pay | Admitting: Family Medicine

## 2023-08-13 DIAGNOSIS — R928 Other abnormal and inconclusive findings on diagnostic imaging of breast: Secondary | ICD-10-CM

## 2023-08-14 ENCOUNTER — Ambulatory Visit
Admission: RE | Admit: 2023-08-14 | Discharge: 2023-08-14 | Disposition: A | Discharge status: Home / Self Care | Source: Ambulatory Visit | Attending: Family Medicine | Admitting: Family Medicine

## 2023-08-14 ENCOUNTER — Other Ambulatory Visit: Payer: Self-pay

## 2023-08-14 DIAGNOSIS — R590 Localized enlarged lymph nodes: Secondary | ICD-10-CM | POA: Diagnosis not present

## 2023-08-14 DIAGNOSIS — R928 Other abnormal and inconclusive findings on diagnostic imaging of breast: Secondary | ICD-10-CM | POA: Diagnosis not present

## 2023-08-15 ENCOUNTER — Ambulatory Visit
Admission: RE | Admit: 2023-08-15 | Discharge: 2023-08-15 | Disposition: A | Discharge status: Home / Self Care | Source: Ambulatory Visit | Attending: Family Medicine | Admitting: Family Medicine

## 2023-08-15 DIAGNOSIS — R928 Other abnormal and inconclusive findings on diagnostic imaging of breast: Secondary | ICD-10-CM | POA: Insufficient documentation

## 2023-08-15 DIAGNOSIS — R590 Localized enlarged lymph nodes: Secondary | ICD-10-CM | POA: Diagnosis not present

## 2023-08-15 DIAGNOSIS — R591 Generalized enlarged lymph nodes: Secondary | ICD-10-CM | POA: Diagnosis not present

## 2023-08-15 DIAGNOSIS — R599 Enlarged lymph nodes, unspecified: Secondary | ICD-10-CM | POA: Diagnosis not present

## 2023-08-15 MED ORDER — LIDOCAINE-EPINEPHRINE 1 %-1:100000 IJ SOLN
8.0000 mL | Freq: Once | INTRAMUSCULAR | Status: AC
  Administered 2023-08-15 (×2): 8 mL
  Filled 2023-08-15: qty 8

## 2023-08-15 MED ORDER — LIDOCAINE 1 % OPTIME INJ - NO CHARGE
2.0000 mL | Freq: Once | INTRAMUSCULAR | Status: AC
  Administered 2023-08-15 (×2): 2 mL
  Filled 2023-08-15: qty 2

## 2023-08-17 LAB — SURGICAL PATHOLOGY

## 2023-09-11 ENCOUNTER — Other Ambulatory Visit: Payer: Self-pay | Admitting: Family Medicine

## 2023-09-11 ENCOUNTER — Other Ambulatory Visit: Payer: Self-pay

## 2023-09-11 MED ORDER — OMEPRAZOLE 20 MG PO CPDR
20.0000 mg | DELAYED_RELEASE_CAPSULE | Freq: Every day | ORAL | 3 refills | Status: AC
  Filled 2023-09-11: qty 30, 30d supply, fill #0
  Filled 2023-10-16: qty 30, 30d supply, fill #1
  Filled 2023-12-06: qty 30, 30d supply, fill #2
  Filled 2023-12-29: qty 30, 30d supply, fill #3

## 2023-09-25 ENCOUNTER — Encounter: Payer: Self-pay | Admitting: Family Medicine

## 2023-09-26 ENCOUNTER — Other Ambulatory Visit (HOSPITAL_COMMUNITY): Payer: Self-pay

## 2023-09-26 NOTE — Telephone Encounter (Signed)
 Per test claim, pt must be enrolled in Weight Watcher's Program through [employer]. Is pt enrolled? Thank you

## 2023-09-27 NOTE — Telephone Encounter (Signed)
 Yes. She is enrolled.

## 2023-09-28 ENCOUNTER — Telehealth: Payer: Self-pay

## 2023-09-28 ENCOUNTER — Other Ambulatory Visit (HOSPITAL_COMMUNITY): Payer: Self-pay

## 2023-09-28 NOTE — Telephone Encounter (Signed)
 Pharmacy Patient Advocate Encounter   Received notification from Physician's Office that prior authorization for Wegovy  2.4mg /0.19ml Pen  is required/requested.   Insurance verification completed.   The patient is insured through [facility] .   Per test claim: PA required; PA submitted to above mentioned insurance via Latent Key/confirmation #/EOC [id number] Status is pending

## 2023-09-28 NOTE — Telephone Encounter (Signed)
 Thank you! I have submitted the PA, it is currently pending.

## 2023-10-01 NOTE — Telephone Encounter (Signed)
 Pharmacy Patient Advocate Encounter  Received notification from [employer] that Prior Authorization for Wegovy  2.4MG /0.75ML auto-injectors has been APPROVED from 09/28/2023 to 09/27/2024   PA #/Case ID/Reference #: [id number]

## 2023-10-04 ENCOUNTER — Other Ambulatory Visit: Payer: Self-pay

## 2023-10-05 ENCOUNTER — Other Ambulatory Visit (HOSPITAL_BASED_OUTPATIENT_CLINIC_OR_DEPARTMENT_OTHER): Payer: Self-pay

## 2023-10-05 ENCOUNTER — Other Ambulatory Visit: Payer: Self-pay

## 2023-10-05 NOTE — Telephone Encounter (Signed)
 My apologies, the approval was received and documented, but I don't think approval got routed to the office. It has been approved though :)

## 2023-10-15 ENCOUNTER — Other Ambulatory Visit: Payer: Self-pay

## 2023-10-16 ENCOUNTER — Other Ambulatory Visit: Payer: Self-pay

## 2023-11-05 ENCOUNTER — Other Ambulatory Visit: Payer: Self-pay

## 2023-11-06 ENCOUNTER — Other Ambulatory Visit: Payer: Self-pay

## 2023-12-06 ENCOUNTER — Other Ambulatory Visit: Payer: Self-pay

## 2023-12-18 DIAGNOSIS — R112 Nausea with vomiting, unspecified: Secondary | ICD-10-CM | POA: Diagnosis not present

## 2023-12-18 DIAGNOSIS — I10 Essential (primary) hypertension: Secondary | ICD-10-CM | POA: Diagnosis not present

## 2023-12-18 DIAGNOSIS — R7303 Prediabetes: Secondary | ICD-10-CM | POA: Diagnosis not present

## 2023-12-18 DIAGNOSIS — Z79899 Other long term (current) drug therapy: Secondary | ICD-10-CM | POA: Diagnosis not present

## 2023-12-18 DIAGNOSIS — Z780 Asymptomatic menopausal state: Secondary | ICD-10-CM | POA: Diagnosis not present

## 2023-12-18 DIAGNOSIS — E669 Obesity, unspecified: Secondary | ICD-10-CM | POA: Diagnosis not present

## 2023-12-18 DIAGNOSIS — R1013 Epigastric pain: Secondary | ICD-10-CM | POA: Diagnosis not present

## 2023-12-19 ENCOUNTER — Other Ambulatory Visit: Payer: Self-pay

## 2023-12-19 MED ORDER — SUCRALFATE 1 G PO TABS
1.0000 g | ORAL_TABLET | Freq: Four times a day (QID) | ORAL | 0 refills | Status: AC
  Filled 2023-12-19: qty 16, 4d supply, fill #0

## 2023-12-19 MED ORDER — ONDANSETRON 4 MG PO TBDP
4.0000 mg | ORAL_TABLET | Freq: Three times a day (TID) | ORAL | 0 refills | Status: AC | PRN
  Filled 2023-12-19: qty 14, 5d supply, fill #0

## 2023-12-20 DIAGNOSIS — R1013 Epigastric pain: Secondary | ICD-10-CM | POA: Diagnosis not present

## 2023-12-30 ENCOUNTER — Other Ambulatory Visit: Payer: Self-pay

## 2024-01-28 ENCOUNTER — Ambulatory Visit: Admitting: Family Medicine

## 2024-01-31 ENCOUNTER — Other Ambulatory Visit: Payer: Self-pay | Admitting: Family Medicine

## 2024-02-01 ENCOUNTER — Other Ambulatory Visit: Payer: Self-pay

## 2024-02-01 MED ORDER — WEGOVY 2.4 MG/0.75ML ~~LOC~~ SOAJ
2.4000 mg | SUBCUTANEOUS | 2 refills | Status: AC
  Filled 2024-02-01: qty 3, 28d supply, fill #0

## 2024-02-11 ENCOUNTER — Ambulatory Visit: Admitting: Family Medicine
